# Patient Record
Sex: Female | Born: 1970 | Race: White | Hispanic: Yes | Marital: Married | Smoking: Never smoker
Health system: Southern US, Community
[De-identification: ages and names within clinical notes are randomized; demographics above are authoritative.]

## PROBLEM LIST (undated history)

## (undated) ENCOUNTER — Inpatient Hospital Stay (HOSPITAL_COMMUNITY): Payer: Self-pay

## (undated) DIAGNOSIS — D649 Anemia, unspecified: Secondary | ICD-10-CM

## (undated) DIAGNOSIS — I10 Essential (primary) hypertension: Secondary | ICD-10-CM

## (undated) DIAGNOSIS — G510 Bell's palsy: Secondary | ICD-10-CM

## (undated) DIAGNOSIS — O09529 Supervision of elderly multigravida, unspecified trimester: Secondary | ICD-10-CM

## (undated) HISTORY — PX: NO PAST SURGERIES: SHX2092

## (undated) HISTORY — DX: Bell's palsy: G51.0

## (undated) HISTORY — DX: Anemia, unspecified: D64.9

## (undated) HISTORY — DX: Essential (primary) hypertension: I10

## (undated) HISTORY — DX: Supervision of elderly multigravida, unspecified trimester: O09.529

---

## 2013-06-25 ENCOUNTER — Other Ambulatory Visit (HOSPITAL_COMMUNITY): Payer: Self-pay | Admitting: Nurse Practitioner

## 2013-06-25 DIAGNOSIS — Z3689 Encounter for other specified antenatal screening: Secondary | ICD-10-CM

## 2013-06-25 LAB — OB RESULTS CONSOLE RUBELLA ANTIBODY, IGM: RUBELLA: IMMUNE

## 2013-06-25 LAB — OB RESULTS CONSOLE ANTIBODY SCREEN: Antibody Screen: NEGATIVE

## 2013-06-25 LAB — OB RESULTS CONSOLE HIV ANTIBODY (ROUTINE TESTING): HIV: NONREACTIVE

## 2013-06-25 LAB — OB RESULTS CONSOLE GC/CHLAMYDIA
Chlamydia: NEGATIVE
GC PROBE AMP, GENITAL: NEGATIVE

## 2013-06-25 LAB — OB RESULTS CONSOLE HEPATITIS B SURFACE ANTIGEN: Hepatitis B Surface Ag: NEGATIVE

## 2013-06-25 LAB — OB RESULTS CONSOLE ABO/RH: RH Type: POSITIVE

## 2013-06-25 LAB — OB RESULTS CONSOLE RPR: RPR: NONREACTIVE

## 2013-08-10 ENCOUNTER — Other Ambulatory Visit (HOSPITAL_COMMUNITY): Payer: Self-pay | Admitting: Nurse Practitioner

## 2013-08-10 ENCOUNTER — Ambulatory Visit (HOSPITAL_COMMUNITY)
Admission: RE | Admit: 2013-08-10 | Discharge: 2013-08-10 | Disposition: A | Discharge status: Home / Self Care | Payer: Self-pay | Source: Ambulatory Visit | Attending: Nurse Practitioner | Admitting: Nurse Practitioner

## 2013-08-10 DIAGNOSIS — Z363 Encounter for antenatal screening for malformations: Secondary | ICD-10-CM | POA: Insufficient documentation

## 2013-08-10 DIAGNOSIS — O09519 Supervision of elderly primigravida, unspecified trimester: Secondary | ICD-10-CM | POA: Insufficient documentation

## 2013-08-10 DIAGNOSIS — Z3689 Encounter for other specified antenatal screening: Secondary | ICD-10-CM

## 2013-08-10 DIAGNOSIS — O358XX0 Maternal care for other (suspected) fetal abnormality and damage, not applicable or unspecified: Secondary | ICD-10-CM | POA: Insufficient documentation

## 2013-08-10 DIAGNOSIS — Z1389 Encounter for screening for other disorder: Secondary | ICD-10-CM | POA: Insufficient documentation

## 2013-10-26 ENCOUNTER — Other Ambulatory Visit (HOSPITAL_COMMUNITY): Payer: Self-pay | Admitting: Nurse Practitioner

## 2013-10-26 DIAGNOSIS — O09529 Supervision of elderly multigravida, unspecified trimester: Secondary | ICD-10-CM

## 2013-10-30 ENCOUNTER — Ambulatory Visit (HOSPITAL_COMMUNITY)
Admission: RE | Admit: 2013-10-30 | Discharge: 2013-10-30 | Disposition: A | Discharge status: Home / Self Care | Payer: Self-pay | Source: Ambulatory Visit | Attending: Nurse Practitioner | Admitting: Nurse Practitioner

## 2013-10-30 DIAGNOSIS — Z3689 Encounter for other specified antenatal screening: Secondary | ICD-10-CM | POA: Insufficient documentation

## 2013-10-30 DIAGNOSIS — O09529 Supervision of elderly multigravida, unspecified trimester: Secondary | ICD-10-CM | POA: Insufficient documentation

## 2013-11-23 ENCOUNTER — Other Ambulatory Visit (HOSPITAL_COMMUNITY): Payer: Self-pay | Admitting: Nurse Practitioner

## 2013-11-23 DIAGNOSIS — Z0374 Encounter for suspected problem with fetal growth ruled out: Secondary | ICD-10-CM

## 2013-12-03 ENCOUNTER — Ambulatory Visit (HOSPITAL_COMMUNITY)
Admission: RE | Admit: 2013-12-03 | Discharge: 2013-12-03 | Disposition: A | Discharge status: Home / Self Care | Payer: Self-pay | Source: Ambulatory Visit | Attending: Nurse Practitioner | Admitting: Nurse Practitioner

## 2013-12-03 DIAGNOSIS — Z0374 Encounter for suspected problem with fetal growth ruled out: Secondary | ICD-10-CM

## 2013-12-03 DIAGNOSIS — O26849 Uterine size-date discrepancy, unspecified trimester: Secondary | ICD-10-CM | POA: Insufficient documentation

## 2013-12-03 DIAGNOSIS — Z3689 Encounter for other specified antenatal screening: Secondary | ICD-10-CM | POA: Insufficient documentation

## 2013-12-07 LAB — OB RESULTS CONSOLE GBS: STREP GROUP B AG: NEGATIVE

## 2013-12-11 ENCOUNTER — Ambulatory Visit (INDEPENDENT_AMBULATORY_CARE_PROVIDER_SITE_OTHER): Payer: Self-pay | Admitting: *Deleted

## 2013-12-11 VITALS — BP 128/85 | HR 85

## 2013-12-11 DIAGNOSIS — O09529 Supervision of elderly multigravida, unspecified trimester: Secondary | ICD-10-CM

## 2013-12-11 DIAGNOSIS — O09519 Supervision of elderly primigravida, unspecified trimester: Secondary | ICD-10-CM

## 2013-12-11 LAB — US OB FOLLOW UP

## 2013-12-11 NOTE — Addendum Note (Signed)
Addended by: Jill SideAY, Keian Odriscoll L on: 12/11/2013 12:38 PM   Modules accepted: Orders

## 2013-12-12 DIAGNOSIS — O09529 Supervision of elderly multigravida, unspecified trimester: Secondary | ICD-10-CM | POA: Insufficient documentation

## 2013-12-12 NOTE — Progress Notes (Signed)
NST reviewed and reactive.  

## 2013-12-14 ENCOUNTER — Other Ambulatory Visit (HOSPITAL_COMMUNITY): Payer: Self-pay | Admitting: Nurse Practitioner

## 2013-12-14 DIAGNOSIS — O09529 Supervision of elderly multigravida, unspecified trimester: Secondary | ICD-10-CM

## 2013-12-18 ENCOUNTER — Ambulatory Visit (INDEPENDENT_AMBULATORY_CARE_PROVIDER_SITE_OTHER): Payer: Self-pay | Admitting: *Deleted

## 2013-12-18 VITALS — BP 119/69 | HR 70

## 2013-12-18 DIAGNOSIS — O09529 Supervision of elderly multigravida, unspecified trimester: Secondary | ICD-10-CM

## 2013-12-18 NOTE — Progress Notes (Signed)
Pt has next appt for prenatal visit and NST @ [facility] on 5/18.  Pt encouraged to increase po fluid intake due to low normal AFI.  IOL scheduled on 5/28 @ 0630 per protocol.  Delorise RoyalsJulie Sowell - (interpreter) present for visit.

## 2013-12-21 ENCOUNTER — Other Ambulatory Visit (HOSPITAL_COMMUNITY): Payer: Self-pay | Admitting: Physician Assistant

## 2013-12-21 DIAGNOSIS — O09529 Supervision of elderly multigravida, unspecified trimester: Secondary | ICD-10-CM

## 2013-12-24 ENCOUNTER — Telehealth (HOSPITAL_COMMUNITY): Payer: Self-pay | Admitting: *Deleted

## 2013-12-24 ENCOUNTER — Encounter (HOSPITAL_COMMUNITY): Payer: Self-pay | Admitting: *Deleted

## 2013-12-24 NOTE — Telephone Encounter (Signed)
Preadmission screen  

## 2013-12-25 ENCOUNTER — Ambulatory Visit (HOSPITAL_COMMUNITY)
Admission: RE | Admit: 2013-12-25 | Discharge: 2013-12-25 | Disposition: A | Discharge status: Home / Self Care | Payer: Medicaid Other | Source: Ambulatory Visit | Attending: Physician Assistant | Admitting: Physician Assistant

## 2013-12-25 DIAGNOSIS — O09529 Supervision of elderly multigravida, unspecified trimester: Secondary | ICD-10-CM | POA: Insufficient documentation

## 2013-12-25 DIAGNOSIS — Z3689 Encounter for other specified antenatal screening: Secondary | ICD-10-CM | POA: Insufficient documentation

## 2013-12-29 ENCOUNTER — Inpatient Hospital Stay (HOSPITAL_COMMUNITY): Payer: Medicaid Other | Admitting: Anesthesiology

## 2013-12-29 ENCOUNTER — Inpatient Hospital Stay (HOSPITAL_COMMUNITY)
Admission: AD | Admit: 2013-12-29 | Discharge: 2014-01-01 | DRG: 765 | Disposition: A | Discharge status: Home / Self Care | Payer: Medicaid Other | Source: Ambulatory Visit | Attending: Obstetrics & Gynecology | Admitting: Obstetrics & Gynecology

## 2013-12-29 ENCOUNTER — Encounter (HOSPITAL_COMMUNITY): Payer: Self-pay | Admitting: *Deleted

## 2013-12-29 ENCOUNTER — Other Ambulatory Visit: Payer: Self-pay

## 2013-12-29 ENCOUNTER — Encounter (HOSPITAL_COMMUNITY): Payer: Medicaid Other | Admitting: Anesthesiology

## 2013-12-29 DIAGNOSIS — O09529 Supervision of elderly multigravida, unspecified trimester: Secondary | ICD-10-CM

## 2013-12-29 DIAGNOSIS — IMO0002 Reserved for concepts with insufficient information to code with codable children: Secondary | ICD-10-CM | POA: Diagnosis present

## 2013-12-29 DIAGNOSIS — O4690 Antepartum hemorrhage, unspecified, unspecified trimester: Secondary | ICD-10-CM | POA: Diagnosis present

## 2013-12-29 DIAGNOSIS — O411090 Infection of amniotic sac and membranes, unspecified, unspecified trimester, not applicable or unspecified: Secondary | ICD-10-CM | POA: Diagnosis present

## 2013-12-29 DIAGNOSIS — O09519 Supervision of elderly primigravida, unspecified trimester: Secondary | ICD-10-CM | POA: Diagnosis present

## 2013-12-29 LAB — ABO/RH: ABO/RH(D): O POS

## 2013-12-29 LAB — CBC
HEMATOCRIT: 38 % (ref 36.0–46.0)
Hemoglobin: 13.3 g/dL (ref 12.0–15.0)
MCH: 31.7 pg (ref 26.0–34.0)
MCHC: 35 g/dL (ref 30.0–36.0)
MCV: 90.7 fL (ref 78.0–100.0)
Platelets: 194 10*3/uL (ref 150–400)
RBC: 4.19 MIL/uL (ref 3.87–5.11)
RDW: 13.3 % (ref 11.5–15.5)
WBC: 10.2 10*3/uL (ref 4.0–10.5)

## 2013-12-29 LAB — COMPREHENSIVE METABOLIC PANEL
ALK PHOS: 174 U/L — AB (ref 39–117)
ALT: 11 U/L (ref 0–35)
AST: 18 U/L (ref 0–37)
Albumin: 2.6 g/dL — ABNORMAL LOW (ref 3.5–5.2)
BILIRUBIN TOTAL: 0.2 mg/dL — AB (ref 0.3–1.2)
BUN: 14 mg/dL (ref 6–23)
CHLORIDE: 98 meq/L (ref 96–112)
CO2: 21 mEq/L (ref 19–32)
CREATININE: 0.67 mg/dL (ref 0.50–1.10)
Calcium: 9.1 mg/dL (ref 8.4–10.5)
GFR calc non Af Amer: >90 mL/min (ref >90)
Glucose, Bld: 80 mg/dL (ref 70–99)
Potassium: 4.3 mEq/L (ref 3.7–5.3)
Sodium: 135 mEq/L — ABNORMAL LOW (ref 137–147)
Total Protein: 6.7 g/dL (ref 6.0–8.3)

## 2013-12-29 LAB — PROTEIN / CREATININE RATIO, URINE
CREATININE, URINE: 41.89 mg/dL
Protein Creatinine Ratio: 0.74 — ABNORMAL HIGH (ref 0.00–0.15)
Total Protein, Urine: 31.1 mg/dL

## 2013-12-29 LAB — TYPE AND SCREEN
ABO/RH(D): O POS
Antibody Screen: NEGATIVE

## 2013-12-29 LAB — RPR

## 2013-12-29 MED ORDER — PHENYLEPHRINE 40 MCG/ML (10ML) SYRINGE FOR IV PUSH (FOR BLOOD PRESSURE SUPPORT)
80.0000 ug | PREFILLED_SYRINGE | INTRAVENOUS | Status: DC | PRN
  Filled 2013-12-29: qty 10

## 2013-12-29 MED ORDER — LACTATED RINGERS IV SOLN
INTRAVENOUS | Status: DC
  Administered 2013-12-29 (×2): via INTRAVENOUS

## 2013-12-29 MED ORDER — OXYTOCIN 40 UNITS IN LACTATED RINGERS INFUSION - SIMPLE MED: 62.5000 mL/h | INTRAVENOUS | Status: DC

## 2013-12-29 MED ORDER — CITRIC ACID-SODIUM CITRATE 334-500 MG/5ML PO SOLN
30.0000 mL | ORAL | Status: DC | PRN
  Administered 2013-12-30: 30 mL via ORAL
  Filled 2013-12-29: qty 15

## 2013-12-29 MED ORDER — EPHEDRINE 5 MG/ML INJ
10.0000 mg | INTRAVENOUS | Status: DC | PRN
  Filled 2013-12-29: qty 4

## 2013-12-29 MED ORDER — FENTANYL CITRATE 0.05 MG/ML IJ SOLN: 100.0000 ug | INTRAMUSCULAR | Status: DC | PRN

## 2013-12-29 MED ORDER — LACTATED RINGERS IV SOLN
500.0000 mL | INTRAVENOUS | Status: DC | PRN
  Administered 2013-12-30: 200 mL via INTRAVENOUS

## 2013-12-29 MED ORDER — FENTANYL 2.5 MCG/ML BUPIVACAINE 1/10 % EPIDURAL INFUSION (WH - ANES): 14.0000 mL/h | INTRAMUSCULAR | Status: DC | PRN

## 2013-12-29 MED ORDER — DIPHENHYDRAMINE HCL 50 MG/ML IJ SOLN: 12.5000 mg | INTRAMUSCULAR | Status: DC | PRN

## 2013-12-29 MED ORDER — PHENYLEPHRINE 40 MCG/ML (10ML) SYRINGE FOR IV PUSH (FOR BLOOD PRESSURE SUPPORT): 80.0000 ug | PREFILLED_SYRINGE | INTRAVENOUS | Status: DC | PRN

## 2013-12-29 MED ORDER — SODIUM BICARBONATE 8.4 % IV SOLN
INTRAVENOUS | Status: DC | PRN
  Administered 2013-12-29 – 2013-12-30 (×3): 5 mL via EPIDURAL

## 2013-12-29 MED ORDER — LIDOCAINE HCL (PF) 1 % IJ SOLN: 30.0000 mL | INTRAMUSCULAR | Status: DC | PRN

## 2013-12-29 MED ORDER — ACETAMINOPHEN 325 MG PO TABS
650.0000 mg | ORAL_TABLET | ORAL | Status: DC | PRN
  Administered 2013-12-30: 650 mg via ORAL
  Filled 2013-12-29: qty 2

## 2013-12-29 MED ORDER — OXYTOCIN BOLUS FROM INFUSION: 500.0000 mL | INTRAVENOUS | Status: DC

## 2013-12-29 MED ORDER — EPHEDRINE 5 MG/ML INJ: 10.0000 mg | INTRAVENOUS | Status: DC | PRN

## 2013-12-29 MED ORDER — ONDANSETRON HCL 4 MG/2ML IJ SOLN: 4.0000 mg | Freq: Four times a day (QID) | INTRAMUSCULAR | Status: DC | PRN

## 2013-12-29 MED ORDER — IBUPROFEN 600 MG PO TABS: 600.0000 mg | ORAL_TABLET | Freq: Four times a day (QID) | ORAL | Status: DC | PRN

## 2013-12-29 MED ORDER — OXYCODONE-ACETAMINOPHEN 5-325 MG PO TABS: 1.0000 | ORAL_TABLET | ORAL | Status: DC | PRN

## 2013-12-29 MED ORDER — LACTATED RINGERS IV SOLN
INTRAVENOUS | Status: DC
  Administered 2013-12-29 – 2013-12-31 (×5): via INTRAVENOUS

## 2013-12-29 MED ORDER — TERBUTALINE SULFATE 1 MG/ML IJ SOLN
0.2500 mg | Freq: Once | INTRAMUSCULAR | Status: AC | PRN
Start: 2013-12-29 — End: 2013-12-29

## 2013-12-29 MED ORDER — FENTANYL 2.5 MCG/ML BUPIVACAINE 1/10 % EPIDURAL INFUSION (WH - ANES)
14.0000 mL/h | INTRAMUSCULAR | Status: DC | PRN
  Administered 2013-12-29: 14 mL/h via EPIDURAL
  Filled 2013-12-29: qty 125

## 2013-12-29 MED ORDER — LACTATED RINGERS IV SOLN
500.0000 mL | Freq: Once | INTRAVENOUS | Status: AC
  Administered 2013-12-29: 500 mL via INTRAVENOUS

## 2013-12-29 NOTE — Progress Notes (Signed)
   Connie Newman is a 43 y.o. G1P0 at [redacted]w[redacted]d  admitted for induction of labor due to AMA/prolonged variable decel. Dx of  Preeclampsia with mild features  Subjective: Comfortable with epidural  Objective: Filed Vitals:   12/29/13 2335 12/29/13 2337 12/29/13 2340 12/29/13 2342  BP: 128/70  129/68   Pulse: 88 92 78 93  Temp:      TempSrc:      Resp: 18  18   Height:      Weight:      SpO2:  97%  97%      FHT:  FHR: 140 bpm, variability: moderate,  accelerations:  Present,  decelerations:  Absent UC:   irregular, every  minutes SVE:   Dilation: 5.5 Effacement (%): 80 Station: -2 Exam by:: C.Byers, RN  Labs: Lab Results  Component Value Date   WBC 10.2 12/29/2013   HGB 13.3 12/29/2013   HCT 38.0 12/29/2013   MCV 90.7 12/29/2013   PLT 194 12/29/2013    Assessment / Plan: IOL for AMA; mild preeclampsia  Labor: Progressing normally Fetal Wellbeing:  Category II Pain Control:  Epidural Anticipated MOD:  NSVD  Jacklyn Shell 12/29/2013, 11:43 PM

## 2013-12-29 NOTE — H&P (Signed)
Connie Newman is a 43 y.o. female G1P0 with IUP at [redacted]w[redacted]d presenting for vaginal bleeding. Pt states that she started having vaginal bleeding this morning and that she has been having minor abdominal contractions that started last night 2/10. Pt states normal fetal movement and no LOF.  [facility]are at [facility] since first trimester Prenatal History/Complications: AMA - 42 abnml 1 hr, normal 3hr  Past Medical History: Past Medical History  Diagnosis Date  . 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  Obstetrical History: OB History   Grav Para Term Preterm Abortions TAB SAB Ect Mult Living   1               Gynecological History: OB History   Grav Para Term Preterm Abortions TAB SAB Ect Mult Living   1               Social History: History   Social History  . Marital Status: Single    Spouse Name: N/A    Number of Children: N/A  . Years of Education: N/A   Social History Main Topics  . Smoking status: Not on file  . Smokeless tobacco: Not on file  . Alcohol Use: Not on file  . Drug Use: Not on file  . Sexual Activity: Not on file   Other Topics Concern  . Not on file   Social History Narrative  . No narrative on file    Family History: No family history on file.  Allergies: No Known Allergies  Prescriptions prior to admission  Medication Sig Dispense Refill  . Prenatal Vit-Fe Fumarate-FA (PRENATAL MULTIVITAMIN) TABS tablet Take 1 tablet by mouth daily at 12 noon.         Review of Systems   Constitutional: no headache, vision changes, or abdominal pain, no other complaints  Blood pressure 148/87, pulse 77, temperature 97.7 F (36.5 C), temperature source Oral, resp. rate 18, last menstrual period 03/26/2013, SpO2 99.00%. General appearance: alert, cooperative, appears stated age and no distress Lungs: clear to auscultation bilaterally Heart: regular  rate and rhythm Abdomen: soft, non-tender; bowel sounds normal Pelvic: adequate, old blood on exam Extremities: Homans sign is negative, no sign of DVT DTR's 1+ Presentation: cephalic Fetal monitoringBaseline: 130s bpm, Variability: Good {> 6 bpm), Accelerations: Reactive and Decelerations: proloned decel to 80s for 3 mins with slow recover with O2, fluid and reposition Uterine activity irreg ctx q4-18min  Dilation: 1 Effacement (%): Thick Station: -3 Exam by:: Dr. Ike Bene   Prenatal labs: ABO, Rh: O/Positive/-- (11/20 0000) Antibody: Negative (11/20 0000) Rubella:   RPR: Nonreactive (11/20 0000)  HBsAg: Negative (11/20 0000)  HIV: Non-reactive (11/20 0000)  GBS: Negative (05/04 0000)    Prenatal Transfer Tool  Maternal Diabetes: Abnl 1 hr, normal 3 hr Genetic Screening: Declined Maternal Ultrasounds/Referrals: Normal Fetal Ultrasounds or other Referrals:  None Maternal Substance Abuse:  No Significant Maternal Medications:  None Significant Maternal Lab Results: Lab values include: Group B Strep negative     No results found for this or any previous visit (from the past 24 hour(s)).  Assessment: Connie Newman is a 43 y.o. G1P0 at [redacted]w[redacted]d by L=20 here for vaginal bleeding having non reassuring fetal status. Will admit and start induction at this tim #Labor: IOL with FB - start now, minimal benefit for 2 additional days with evidence of fetal distress on  monitor #Pain: Desires IV pain meds and Epidural #FWB: Cat II #ID:  GBS Neg no ABx at this time #MOF: Breast #HTN: elevated BP - collect CMP and Pro:Cr  Minta BalsamMichael R Taeja Debellis 12/29/2013, 12:53 PM

## 2013-12-29 NOTE — MAU Note (Signed)
Pt reports some bleeding yesterday and contractions today. +FM.

## 2013-12-29 NOTE — Progress Notes (Addendum)
Connie Newman is a 43 y.o. G1P0 at [redacted]w[redacted]d admitted for induction of labor due to [facility] and NRFHT.  Subjective: Reports more discomfort, though notes she does not want anything for this at this time.  Objective: BP 149/88  Pulse 80  Temp(Src) 98.6 F (37 C) (Oral)  Resp 18  Ht 5' 1.42" (1.56 m)  Wt 78.472 kg (173 lb)  BMI 32.25 kg/m2  SpO2 99%  LMP 03/26/2013    SROM a few minutes ago with clear fluid.  Foley still in  FHT:  FHR: 145 bpm, variability: moderate,  accelerations:  Present,  decelerations:  Absent UC:   irregular, every 3-10 minutes SVE:   Dilation: 3 Effacement (%): Thick Station: -3 Exam by:: F. Crezenso-Dishmon, CNM  Labs: Lab Results  Component Value Date   WBC 10.2 12/29/2013   HGB 13.3 12/29/2013   HCT 38.0 12/29/2013   MCV 90.7 12/29/2013   PLT 194 12/29/2013    Assessment / Plan: Induction of labor due to AMA and NRFHT,  progressing well on pitocin  Labor: progressing with foley bulb Preeclampsia:  patient with mild pre-eclampsia, P/C 0.74. Will plan for magnesium if reaches severe range pre-eclampsia Fetal Wellbeing:  Category I Pain Control:  Labor support without medications I/D:  n/a Anticipated MOD:  NSVD  Glori Luis 12/29/2013, 10:01 PM

## 2013-12-29 NOTE — Progress Notes (Signed)
Connie Newman is a 43 y.o. G1P0 at [redacted]w[redacted]d admitted for induction of labor due to [facility] and NRFHT.  Subjective: Comfortable no complaints  Objective: BP 129/70  Pulse 66  Temp(Src) 98.1 F (36.7 C) (Oral)  Resp 20  Ht 5' 1.42" (1.56 m)  Wt 78.472 kg (173 lb)  BMI 32.25 kg/m2  SpO2 99%  LMP 03/26/2013      FHT:  FHR: 140s bpm, variability: moderate,  accelerations:   ,  decelerations:  Absent UC:   irregular, every 6-10 minutes SVE:   Dilation: 1 Effacement (%): Thick Station: -3 Exam by:: Dr. Ike Bene  Labs: Lab Results  Component Value Date   WBC 10.2 12/29/2013   HGB 13.3 12/29/2013   HCT 38.0 12/29/2013   MCV 90.7 12/29/2013   PLT 194 12/29/2013   Results for orders placed during the hospital encounter of 12/29/13 (from the past 24 hour(s))  ABO/RH     Status: None   Collection Time    12/29/13 12:25 PM      Result Value Ref Range   ABO/RH(D) O POS    CBC     Status: None   Collection Time    12/29/13 12:35 PM      Result Value Ref Range   WBC 10.2  4.0 - 10.5 K/uL   RBC 4.19  3.87 - 5.11 MIL/uL   Hemoglobin 13.3  12.0 - 15.0 g/dL   HCT 37.1  06.2 - 69.4 %   MCV 90.7  78.0 - 100.0 fL   MCH 31.7  26.0 - 34.0 pg   MCHC 35.0  30.0 - 36.0 g/dL   RDW 85.4  62.7 - 03.5 %   Platelets 194  150 - 400 K/uL  COMPREHENSIVE METABOLIC PANEL     Status: Abnormal   Collection Time    12/29/13 12:35 PM      Result Value Ref Range   Sodium 135 (*) 137 - 147 mEq/L   Potassium 4.3  3.7 - 5.3 mEq/L   Chloride 98  96 - 112 mEq/L   CO2 21  19 - 32 mEq/L   Glucose, Bld 80  70 - 99 mg/dL   BUN 14  6 - 23 mg/dL   Creatinine, Ser 0.09  0.50 - 1.10 mg/dL   Calcium 9.1  8.4 - 38.1 mg/dL   Total Protein 6.7  6.0 - 8.3 g/dL   Albumin 2.6 (*) 3.5 - 5.2 g/dL   AST 18  0 - 37 U/L   ALT 11  0 - 35 U/L   Alkaline Phosphatase 174 (*) 39 - 117 U/L   Total Bilirubin 0.2 (*) 0.3 - 1.2 mg/dL   GFR calc non Af Amer >90  >90 mL/min   GFR calc Af Amer >90  >90 mL/min  TYPE AND SCREEN      Status: None   Collection Time    12/29/13 12:35 PM      Result Value Ref Range   ABO/RH(D) O POS     Antibody Screen NEG     Sample Expiration 01/01/2014    PROTEIN / CREATININE RATIO, URINE     Status: Abnormal   Collection Time    12/29/13  3:59 PM      Result Value Ref Range   Creatinine, Urine 41.89     Total Protein, Urine 31.1     PROTEIN CREATININE RATIO 0.74 (*) 0.00 - 0.15     Assessment / Plan: IOL for NRFHT, FB in place  Labor: FB in place, augment as needed Preeclampsia:  no signs or symptoms of toxicity and labs stable Fetal Wellbeing:  Category I Pain Control:  Epidural and Fentanyl PRN I/D:  n/a Anticipated MOD:  NSVD  Minta BalsamMichael R Teigan Sahli 12/29/2013, 5:46 PM

## 2013-12-29 NOTE — Anesthesia Preprocedure Evaluation (Signed)

## 2013-12-29 NOTE — Anesthesia Procedure Notes (Signed)

## 2013-12-29 NOTE — MAU Note (Signed)
Patient presents to MAU with c/o irregular contractions every 10 minutes. Reports bloody show since yesterday. Reports good fetal movement. Denies LOF. States was closed at last SVE last week. Rates pain 2/10. (Interpretor at bedside)

## 2013-12-29 NOTE — Telephone Encounter (Signed)
[id number] interpreter number

## 2013-12-30 ENCOUNTER — Encounter (HOSPITAL_COMMUNITY)
Admission: AD | Disposition: A | Discharge status: Home / Self Care | Payer: Self-pay | Source: Ambulatory Visit | Attending: Obstetrics & Gynecology

## 2013-12-30 ENCOUNTER — Encounter (HOSPITAL_COMMUNITY): Payer: Self-pay

## 2013-12-30 DIAGNOSIS — IMO0002 Reserved for concepts with insufficient information to code with codable children: Secondary | ICD-10-CM

## 2013-12-30 DIAGNOSIS — O411090 Infection of amniotic sac and membranes, unspecified, unspecified trimester, not applicable or unspecified: Secondary | ICD-10-CM

## 2013-12-30 SURGERY — Surgical Case
Anesthesia: Epidural | Site: Abdomen

## 2013-12-30 MED ORDER — BUPIVACAINE HCL (PF) 0.5 % IJ SOLN
INTRAMUSCULAR | Status: AC
  Filled 2013-12-30: qty 30

## 2013-12-30 MED ORDER — DIPHENHYDRAMINE HCL 25 MG PO CAPS: 25.0000 mg | ORAL_CAPSULE | Freq: Four times a day (QID) | ORAL | Status: DC | PRN

## 2013-12-30 MED ORDER — LACTATED RINGERS IV SOLN
INTRAVENOUS | Status: DC
  Administered 2013-12-30: 18:00:00 via INTRAVENOUS

## 2013-12-30 MED ORDER — MEPERIDINE HCL 25 MG/ML IJ SOLN: 6.2500 mg | INTRAMUSCULAR | Status: DC | PRN

## 2013-12-30 MED ORDER — KETOROLAC TROMETHAMINE 30 MG/ML IJ SOLN
30.0000 mg | Freq: Four times a day (QID) | INTRAMUSCULAR | Status: AC | PRN
Start: 2013-12-30 — End: 2013-12-31
  Administered 2013-12-30: 30 mg via INTRAVENOUS

## 2013-12-30 MED ORDER — SIMETHICONE 80 MG PO CHEW: 80.0000 mg | CHEWABLE_TABLET | ORAL | Status: DC | PRN

## 2013-12-30 MED ORDER — KETOROLAC TROMETHAMINE 30 MG/ML IJ SOLN: 30.0000 mg | Freq: Four times a day (QID) | INTRAMUSCULAR | Status: AC | PRN

## 2013-12-30 MED ORDER — DEXTROSE 5 % IV SOLN
1.0000 ug/kg/h | INTRAVENOUS | Status: DC | PRN
  Filled 2013-12-30: qty 2

## 2013-12-30 MED ORDER — LIDOCAINE-EPINEPHRINE (PF) 2 %-1:200000 IJ SOLN
INTRAMUSCULAR | Status: AC
  Filled 2013-12-30: qty 20

## 2013-12-30 MED ORDER — LANOLIN HYDROUS EX OINT: 1.0000 "application " | TOPICAL_OINTMENT | CUTANEOUS | Status: DC | PRN

## 2013-12-30 MED ORDER — MORPHINE SULFATE 0.5 MG/ML IJ SOLN
INTRAMUSCULAR | Status: AC
  Filled 2013-12-30: qty 10

## 2013-12-30 MED ORDER — SENNOSIDES-DOCUSATE SODIUM 8.6-50 MG PO TABS
2.0000 | ORAL_TABLET | ORAL | Status: DC
  Administered 2013-12-31 (×2): 2 via ORAL
  Filled 2013-12-30 (×2): qty 2

## 2013-12-30 MED ORDER — SCOPOLAMINE 1 MG/3DAYS TD PT72
MEDICATED_PATCH | TRANSDERMAL | Status: AC
  Filled 2013-12-30: qty 1

## 2013-12-30 MED ORDER — SIMETHICONE 80 MG PO CHEW
80.0000 mg | CHEWABLE_TABLET | ORAL | Status: DC
  Administered 2013-12-31 (×2): 80 mg via ORAL
  Filled 2013-12-30 (×2): qty 1

## 2013-12-30 MED ORDER — TERBUTALINE SULFATE 1 MG/ML IJ SOLN
INTRAMUSCULAR | Status: AC
Start: 2013-12-30 — End: 2013-12-30
  Filled 2013-12-30: qty 1

## 2013-12-30 MED ORDER — DIPHENHYDRAMINE HCL 50 MG/ML IJ SOLN: 25.0000 mg | INTRAMUSCULAR | Status: DC | PRN

## 2013-12-30 MED ORDER — ZOLPIDEM TARTRATE 5 MG PO TABS: 5.0000 mg | ORAL_TABLET | Freq: Every evening | ORAL | Status: DC | PRN

## 2013-12-30 MED ORDER — MORPHINE SULFATE (PF) 0.5 MG/ML IJ SOLN
INTRAMUSCULAR | Status: DC | PRN
  Administered 2013-12-30: 4 mg via EPIDURAL

## 2013-12-30 MED ORDER — OXYTOCIN 40 UNITS IN LACTATED RINGERS INFUSION - SIMPLE MED: 62.5000 mL/h | INTRAVENOUS | Status: AC

## 2013-12-30 MED ORDER — LACTATED RINGERS IV SOLN
INTRAVENOUS | Status: DC | PRN
  Administered 2013-12-30: 04:00:00 via INTRAVENOUS

## 2013-12-30 MED ORDER — OXYCODONE-ACETAMINOPHEN 5-325 MG PO TABS: 1.0000 | ORAL_TABLET | ORAL | Status: DC | PRN

## 2013-12-30 MED ORDER — KETOROLAC TROMETHAMINE 30 MG/ML IJ SOLN
INTRAMUSCULAR | Status: AC
  Filled 2013-12-30: qty 1

## 2013-12-30 MED ORDER — TERBUTALINE SULFATE 1 MG/ML IJ SOLN
0.2500 mg | Freq: Once | INTRAMUSCULAR | Status: AC
  Administered 2013-12-30: 0.25 mg via SUBCUTANEOUS

## 2013-12-30 MED ORDER — SODIUM CHLORIDE 0.9 % IV SOLN
2.0000 g | Freq: Four times a day (QID) | INTRAVENOUS | Status: DC
  Administered 2013-12-30: 2 g via INTRAVENOUS
  Filled 2013-12-30 (×5): qty 2000

## 2013-12-30 MED ORDER — ONDANSETRON HCL 4 MG/2ML IJ SOLN
INTRAMUSCULAR | Status: AC
  Filled 2013-12-30: qty 2

## 2013-12-30 MED ORDER — ONDANSETRON HCL 4 MG/2ML IJ SOLN: 4.0000 mg | Freq: Three times a day (TID) | INTRAMUSCULAR | Status: DC | PRN

## 2013-12-30 MED ORDER — PROMETHAZINE HCL 25 MG/ML IJ SOLN
6.2500 mg | Freq: Once | INTRAMUSCULAR | Status: AC
  Administered 2013-12-30: 6.25 mg via INTRAVENOUS

## 2013-12-30 MED ORDER — METOCLOPRAMIDE HCL 5 MG/ML IJ SOLN
INTRAMUSCULAR | Status: AC
  Filled 2013-12-30: qty 2

## 2013-12-30 MED ORDER — BUPIVACAINE HCL (PF) 0.5 % IJ SOLN
INTRAMUSCULAR | Status: DC | PRN
  Administered 2013-12-30: 30 mL

## 2013-12-30 MED ORDER — HYDROMORPHONE HCL PF 1 MG/ML IJ SOLN: 0.2500 mg | INTRAMUSCULAR | Status: DC | PRN

## 2013-12-30 MED ORDER — ONDANSETRON 8 MG/NS 50 ML IVPB
8.0000 mg | Freq: Once | INTRAVENOUS | Status: AC
Start: 2013-12-30 — End: 2013-12-30
  Administered 2013-12-30: 8 mg via INTRAVENOUS
  Filled 2013-12-30: qty 8

## 2013-12-30 MED ORDER — DIBUCAINE 1 % RE OINT: 1.0000 "application " | TOPICAL_OINTMENT | RECTAL | Status: DC | PRN

## 2013-12-30 MED ORDER — SIMETHICONE 80 MG PO CHEW
80.0000 mg | CHEWABLE_TABLET | Freq: Three times a day (TID) | ORAL | Status: DC
  Administered 2013-12-30 – 2014-01-01 (×5): 80 mg via ORAL
  Filled 2013-12-30 (×4): qty 1

## 2013-12-30 MED ORDER — ONDANSETRON HCL 4 MG/2ML IJ SOLN
INTRAMUSCULAR | Status: DC | PRN
  Administered 2013-12-30: 4 mg via INTRAVENOUS

## 2013-12-30 MED ORDER — PIPERACILLIN-TAZOBACTAM 3.375 G IVPB
3.3750 g | Freq: Three times a day (TID) | INTRAVENOUS | Status: DC
  Administered 2013-12-30 – 2013-12-31 (×3): 3.375 g via INTRAVENOUS
  Filled 2013-12-30 (×5): qty 50

## 2013-12-30 MED ORDER — DIPHENHYDRAMINE HCL 50 MG/ML IJ SOLN: 12.5000 mg | INTRAMUSCULAR | Status: DC | PRN

## 2013-12-30 MED ORDER — DIPHENHYDRAMINE HCL 25 MG PO CAPS: 25.0000 mg | ORAL_CAPSULE | ORAL | Status: DC | PRN

## 2013-12-30 MED ORDER — SODIUM CHLORIDE 0.9 % IJ SOLN: 3.0000 mL | INTRAMUSCULAR | Status: DC | PRN

## 2013-12-30 MED ORDER — ONDANSETRON HCL 4 MG/2ML IJ SOLN
4.0000 mg | INTRAMUSCULAR | Status: DC | PRN
  Administered 2013-12-30: 4 mg via INTRAVENOUS

## 2013-12-30 MED ORDER — SCOPOLAMINE 1 MG/3DAYS TD PT72
1.0000 | MEDICATED_PATCH | Freq: Once | TRANSDERMAL | Status: DC
  Administered 2013-12-30: 1.5 mg via TRANSDERMAL

## 2013-12-30 MED ORDER — NALBUPHINE HCL 10 MG/ML IJ SOLN: 5.0000 mg | INTRAMUSCULAR | Status: DC | PRN

## 2013-12-30 MED ORDER — IBUPROFEN 600 MG PO TABS
600.0000 mg | ORAL_TABLET | Freq: Four times a day (QID) | ORAL | Status: DC
  Administered 2013-12-30 – 2014-01-01 (×8): 600 mg via ORAL
  Filled 2013-12-30 (×8): qty 1

## 2013-12-30 MED ORDER — LACTATED RINGERS IV BOLUS (SEPSIS)
500.0000 mL | Freq: Once | INTRAVENOUS | Status: AC
  Administered 2013-12-30: 500 mL via INTRAVENOUS

## 2013-12-30 MED ORDER — OXYTOCIN 10 UNIT/ML IJ SOLN
INTRAMUSCULAR | Status: AC
  Filled 2013-12-30: qty 4

## 2013-12-30 MED ORDER — 0.9 % SODIUM CHLORIDE (POUR BTL) OPTIME
TOPICAL | Status: DC | PRN
  Administered 2013-12-30: 1000 mL

## 2013-12-30 MED ORDER — WITCH HAZEL-GLYCERIN EX PADS: 1.0000 "application " | MEDICATED_PAD | CUTANEOUS | Status: DC | PRN

## 2013-12-30 MED ORDER — GENTAMICIN SULFATE 40 MG/ML IJ SOLN
160.0000 mg | Freq: Once | INTRAVENOUS | Status: AC
  Administered 2013-12-30: 160 mg via INTRAVENOUS
  Filled 2013-12-30: qty 4

## 2013-12-30 MED ORDER — PROMETHAZINE HCL 25 MG/ML IJ SOLN
INTRAMUSCULAR | Status: AC
  Filled 2013-12-30: qty 1

## 2013-12-30 MED ORDER — GENTAMICIN SULFATE 40 MG/ML IJ SOLN
150.0000 mg | Freq: Three times a day (TID) | INTRAMUSCULAR | Status: DC
  Filled 2013-12-30: qty 3.75

## 2013-12-30 MED ORDER — OXYTOCIN 10 UNIT/ML IJ SOLN
40.0000 [IU] | INTRAVENOUS | Status: DC | PRN
  Administered 2013-12-30: 40 [IU] via INTRAVENOUS

## 2013-12-30 MED ORDER — PRENATAL MULTIVITAMIN CH
1.0000 | ORAL_TABLET | Freq: Every day | ORAL | Status: DC
  Administered 2013-12-31 – 2014-01-01 (×2): 1 via ORAL
  Filled 2013-12-30 (×2): qty 1

## 2013-12-30 MED ORDER — METOCLOPRAMIDE HCL 5 MG/ML IJ SOLN
10.0000 mg | Freq: Three times a day (TID) | INTRAMUSCULAR | Status: DC | PRN
  Administered 2013-12-30: 10 mg via INTRAVENOUS

## 2013-12-30 MED ORDER — MORPHINE SULFATE (PF) 0.5 MG/ML IJ SOLN
INTRAMUSCULAR | Status: DC | PRN
  Administered 2013-12-30: 1 mg via INTRAVENOUS

## 2013-12-30 MED ORDER — ONDANSETRON HCL 4 MG PO TABS: 4.0000 mg | ORAL_TABLET | ORAL | Status: DC | PRN

## 2013-12-30 MED ORDER — TETANUS-DIPHTH-ACELL PERTUSSIS 5-2.5-18.5 LF-MCG/0.5 IM SUSP: 0.5000 mL | Freq: Once | INTRAMUSCULAR | Status: DC

## 2013-12-30 MED ORDER — MENTHOL 3 MG MT LOZG: 1.0000 | LOZENGE | OROMUCOSAL | Status: DC | PRN

## 2013-12-30 MED ORDER — NALOXONE HCL 0.4 MG/ML IJ SOLN: 0.4000 mg | INTRAMUSCULAR | Status: DC | PRN

## 2013-12-30 SURGICAL SUPPLY — 37 items
BARRIER ADHS 3X4 INTERCEED (GAUZE/BANDAGES/DRESSINGS) IMPLANT
BENZOIN TINCTURE PRP APPL 2/3 (GAUZE/BANDAGES/DRESSINGS) ×3 IMPLANT
CLAMP CORD UMBIL (MISCELLANEOUS) IMPLANT
CLOSURE WOUND 1/2 X4 (GAUZE/BANDAGES/DRESSINGS) ×1
CLOTH BEACON ORANGE TIMEOUT ST (SAFETY) ×3 IMPLANT
CONTAINER PREFILL 10% NBF 15ML (MISCELLANEOUS) IMPLANT
DRAPE LG THREE QUARTER DISP (DRAPES) IMPLANT
DRSG OPSITE POSTOP 4X10 (GAUZE/BANDAGES/DRESSINGS) ×3 IMPLANT
DURAPREP 26ML APPLICATOR (WOUND CARE) ×3 IMPLANT
ELECT REM PT RETURN 9FT ADLT (ELECTROSURGICAL) ×3
ELECTRODE REM PT RTRN 9FT ADLT (ELECTROSURGICAL) ×1 IMPLANT
EXTRACTOR VACUUM KIWI (MISCELLANEOUS) IMPLANT
GLOVE BIO SURGEON STRL SZ 6.5 (GLOVE) ×2 IMPLANT
GLOVE BIO SURGEONS STRL SZ 6.5 (GLOVE) ×1
GOWN STRL REUS W/TWL LRG LVL3 (GOWN DISPOSABLE) ×6 IMPLANT
KIT ABG SYR 3ML LUER SLIP (SYRINGE) IMPLANT
NEEDLE HYPO 25X5/8 SAFETYGLIDE (NEEDLE) IMPLANT
NEEDLE SPNL 18GX3.5 QUINCKE PK (NEEDLE) ×3 IMPLANT
NS IRRIG 1000ML POUR BTL (IV SOLUTION) ×3 IMPLANT
PACK C SECTION WH (CUSTOM PROCEDURE TRAY) ×3 IMPLANT
PAD OB MATERNITY 4.3X12.25 (PERSONAL CARE ITEMS) ×3 IMPLANT
STRIP CLOSURE SKIN 1/2X4 (GAUZE/BANDAGES/DRESSINGS) ×2 IMPLANT
SUT PDS AB 0 CTX 60 (SUTURE) IMPLANT
SUT VIC AB 0 CT1 27 (SUTURE)
SUT VIC AB 0 CT1 27XBRD ANBCTR (SUTURE) IMPLANT
SUT VIC AB 0 CT1 36 (SUTURE) IMPLANT
SUT VIC AB 2-0 CT1 27 (SUTURE) ×2
SUT VIC AB 2-0 CT1 TAPERPNT 27 (SUTURE) ×1 IMPLANT
SUT VIC AB 2-0 CTX 36 (SUTURE) ×6 IMPLANT
SUT VIC AB 3-0 CT1 27 (SUTURE) ×2
SUT VIC AB 3-0 CT1 TAPERPNT 27 (SUTURE) ×1 IMPLANT
SUT VIC AB 3-0 SH 27 (SUTURE)
SUT VIC AB 3-0 SH 27X BRD (SUTURE) IMPLANT
SYR 30ML LL (SYRINGE) ×3 IMPLANT
TOWEL OR 17X24 6PK STRL BLUE (TOWEL DISPOSABLE) ×3 IMPLANT
TRAY FOLEY CATH 14FR (SET/KITS/TRAYS/PACK) ×3 IMPLANT
WATER STERILE IRR 1000ML POUR (IV SOLUTION) ×3 IMPLANT

## 2013-12-30 NOTE — Lactation Note (Signed)
This note was copied from the chart of Connie Barbette Reichmann. Lactation Consultation Note Called to come to PACU by Nursery RN d/t can't get baby latched well, baby keeps pulling off and mom semi flat. Mom C/S w/nausea. RN states gets baby latched and will suck a couple of times and just holds nipple or pops off frequently. FOB keeps insisting baby is hungry, need to feed baby. I took a hand pump to give to mom. Mom w/emesis at this time, doesn't feel well. Explained to FOB that baby is resting quietly and will feed when mom is feeling better. Hand pump given to RN and will pass on to on coming nurse. Patient Name: Connie Newman Today's Date: 12/30/2013     Maternal Data    Feeding Feeding Type: Breast Fed Length of feed: 0 min  LATCH Score/Interventions Latch: Repeated attempts needed to sustain latch, nipple held in mouth throughout feeding, stimulation needed to elicit sucking reflex. Intervention(s): Adjust position;Assist with latch;Breast compression (when breast tissue compressed nipple goes flat)  Audible Swallowing: None Intervention(s): Skin to skin;Hand expression  Type of Nipple: Everted at rest and after stimulation  Comfort (Breast/Nipple): Soft / non-tender     Hold (Positioning): Full assist, staff holds infant at breast  [facility] Score: 5  Lactation Tools Discussed/Used     Consult Status      Charyl Dancer 12/30/2013, 6:11 AM

## 2013-12-30 NOTE — Lactation Note (Signed)
This note was copied from the chart of [name]. Lactation Consultation Note Follow up visit at 13 hours of age, with spanish interpreter Sharl Ma.  Mom was visited by lactation in PACU.  Baby is latched to right breast in football hold with wide flanged lips.  Rhythmic suckling bursts noted, but baby is sleepy and need stimulation to stay awake.  Instructed mom on how to unlatch baby and moved to left breast.  Nipple is erect, but flattens with compression.  Hand pump used and helps to evert nipple some.  Assisted and instructed on cross cradle hold.  Assistance needed to achieve deep latch.  Several swallows heard.  Baby maintained about 10 minutes of active suckling then sleepy again.  Discussed [facility] LC resources and feeding frequency.  Mom to call for assist as needed.   Patient Name: [name] Date: 12/30/2013 Reason for consult: Follow-up assessment   Maternal Data Has patient been taught Hand Expression?: Yes Does the patient have breastfeeding experience prior to this delivery?: No  Feeding Feeding Type: Breast Fed Length of feed: 10 min  LATCH Score/Interventions Latch: Repeated attempts needed to sustain latch, nipple held in mouth throughout feeding, stimulation needed to elicit sucking reflex. Intervention(s): Breast compression;Assist with latch  Audible Swallowing: A few with stimulation Intervention(s): Skin to skin;Hand expression  Type of Nipple: Flat Intervention(s): Hand pump  Comfort (Breast/Nipple): Soft / non-tender     Hold (Positioning): Assistance needed to correctly position infant at breast and maintain latch. Intervention(s): Breastfeeding basics reviewed;Support Pillows;Position options;Skin to skin  LATCH Score: 6  Lactation Tools Discussed/Used     Consult Status Consult Status: Follow-up Date: 12/31/13 Follow-up type: In-patient    Arvella Merles Shoptaw 12/30/2013, 5:41 PM

## 2013-12-30 NOTE — Transfer of Care (Signed)
Immediate Anesthesia Transfer of Care Note  Patient: Connie Newman  Procedure(s) Performed: Procedure(s): CESAREAN SECTION (N/A)  Patient Location: PACU  Anesthesia Type:Epidural  Level of Consciousness: awake, alert , oriented and patient cooperative  Airway & Oxygen Therapy: Patient Spontanous Breathing  Post-op Assessment: Report given to PACU RN and Post -op Vital signs reviewed and stable  Post vital signs: Reviewed and stable  Complications: No apparent anesthesia complications

## 2013-12-30 NOTE — Op Note (Signed)
12/29/2013 - 12/30/2013  4:20 AM  PATIENT:  Connie Newman  43 y.o. female  PRE-OPERATIVE DIAGNOSIS:  nonreassuring fetal heart rate  POST-OPERATIVE DIAGNOSIS:  nonreassuring fetal heart rate  PROCEDURE:  Procedure(s): CESAREAN SECTION (N/A)  FINDINGS: Living female infant, Apgars 9&9, intact meconium stained placenta, normal adnexa  SURGEON:  Surgeon(s) and Role:    * Allie Bossier, MD - Primary  PHYSICIAN ASSISTANT:   ASSISTANTS: none   ANESTHESIA:   epidural  EBL:  Total I/O In: 500 [I.V.:500] Out: 1100 [Urine:500; Blood:600]  BLOOD ADMINISTERED:none  DRAINS: none   LOCAL MEDICATIONS USED:  MARCAINE     SPECIMEN:  Source of Specimen:  cord blood  DISPOSITION OF SPECIMEN:  PATHOLOGY  COUNTS:  YES  TOURNIQUET:  * No tourniquets in log *  DICTATION: .Dragon Dictation  PLAN OF CARE: Admit to inpatient   PATIENT DISPOSITION:  PACU - hemodynamically stable.   Delay start of Pharmacological VTE agent (>24hrs) due to surgical blood loss or risk of bleeding: not applicable  The risks, benefits, and alternatives of surgery were explained, understood, accepted. Consents were signed. All questions were answered. Her epidural was bolused for surgery. In the OR her abdomen and vagina were prepped and draped in the usual sterile fashion. A Foley catheter had been placed, draining clear urine throughout most of the case. For about 1 minute after delivery of the baby, there was a blood tinge to the urine but this quickly cleared. Timeout procedure was done. After adequate anesthesia was assured, 30 mL of 0.5% Marcaine was injected into the subcutaneous tissue about 2 cm above the symphysis pubis. An incision was made there. The incision was carried down through the subcutaneous tissue to the fascia. The fascia was scored the midline and extended bilaterally. The middle 20% of the rectus muscles were separated in a transverse fashion using electrosurgical technique. Excellent  hemostasis was maintained. The peritoneum was entered with hemostats. Peritoneal incision was extended bilaterally with the Bovie. The bladder blade was placed. A transverse incision was made on the very well-developed lower uterine segment. The uterine incision was extended with traction on each side.  Meconium fluid was noted with amniotomy. The baby was delivered from a vertex lie with a OA presentation. The mouth and nostrils were suctioned prior to delivery of the shoulders.  The baby's cord was clamped and cut, and she was transferred to the NICU personnel for routine care. The placenta was delivered intact with traction. The uterus was left in situ and the interior was cleaned with a dry lap sponge. The uterine incision was closed with 2-0 Vicryl running locking suture in one layer. Excellent hemostasis was noted. By tilting the uterus each side was able to visualize the adnexa, and they were normal. The rectus fascia rectus muscles were noted be hemostatic as well. The fascia was closed with a 0 Vicryl suture in a running nonlocking fashion. No defects were palpable. The subcutaneous tissue was irrigated, clean, and dried. A subcuticular closure was done with a 3-0 Vicryl suture. Steri-Strips are placed. Excellent cosmetic results were obtained. She was taken to the recovery room in stable condition. She tolerated the procedure well.

## 2013-12-30 NOTE — Progress Notes (Signed)
   Connie Newman is a 43 y.o. G1P0 at [redacted]w[redacted]d  admitted for induction of labor due to [facility].  Subjective:   Objective: Filed Vitals:   12/30/13 0030 12/30/13 0100 12/30/13 0130 12/30/13 0200  BP: 144/77 129/78 138/76 135/70  Pulse: 72 95 77 81  Temp: 99.6 F (37.6 C)  100.3 F (37.9 C)   TempSrc:   Axillary   Resp: 18 18 18 18   Height:      Weight:      SpO2:          FHT:  FHR: 160 bpm, variability: marked,  accelerations:  Abscent,  decelerations:  Present variable/late UC:   regular, every 2 minutes SVE:   Dilation: 7 Effacement (%): 90 Station: 0 Exam by:: Drenda Freeze, CNM  Labs: Lab Results  Component Value Date   WBC 10.2 12/29/2013   HGB 13.3 12/29/2013   HCT 38.0 12/29/2013   MCV 90.7 12/29/2013   PLT 194 12/29/2013    Assessment / Plan: Induction of labor due to AMA,  progressing well on pitocin Chorioamnionitis Preeclampsia, mild  Labor: Progressing normally Fetal Wellbeing:  Category II Pain Control:  Epidural Anticipated MOD:  NSVD  Jacklyn Shell 12/30/2013, 2:16 AM

## 2013-12-30 NOTE — Progress Notes (Signed)
ANTIBIOTIC CONSULT NOTE - INITIAL  Pharmacy Consult for Gentamicin Indication: Chorioamnionitis   No Known Allergies  Patient Measurements: Height: 5' 1.42" (156 cm) Weight: 173 lb (78.472 kg) IBW/kg (Calculated) : 48.77 Adjusted Body Weight: 57 kg  Vital Signs: Temp: 100.3 F (37.9 C) (05/27 0130) Temp src: Axillary (05/27 0130) BP: 138/76 mmHg (05/27 0130) Pulse Rate: 77 (05/27 0130)  Labs:  Recent Labs  12/29/13 1235 12/29/13 1559  WBC 10.2  --   HGB 13.3  --   PLT 194  --   LABCREA  --  41.89  CREATININE 0.67  --    No results found for this basename: GENTTROUGH, GENTPEAK, GENTRANDOM,  in the last 72 hours   Microbiology: Recent Results (from the past 720 hour(s))  OB RESULTS CONSOLE GBS     Status: None   Collection Time    12/07/13 12:00 AM      Result Value Ref Range Status   GBS Negative   Final    Medications:  Ampicillin 2 grams Q 6 hours  Assessment: 43 y.o. female G1P0 at [redacted]w[redacted]d presented with vaginal bleeding and non reassuring fetal status.  IOL. Estimated Ke = 0.253, Vd = 0.4  Goal of Therapy:  Gentamicin peak 6-8 mg/L and Trough < 1 mg/L  Plan:  Gentamicin 160 mg IV x 1  Gentamicin 150 mg IV every 8 hrs  Check Scr with next labs if gentamicin continued. Will check gentamicin levels if continued > 72hr or clinically indicated.  Kerrigan Gombos S Tanyika Barros 12/30/2013,2:00 AM

## 2013-12-30 NOTE — Progress Notes (Signed)
ANTIBIOTIC CONSULT NOTE - INITIAL  Pharmacy Consult for : Zosyn Indication: Chorioamnionitis  No Known Allergies  Patient Measurements: Height: 5' 1.42" (156 cm) Weight: 173 lb (78.472 kg) IBW/kg (Calculated) : 48.77  Vital Signs: Temp: 98.7 F (37.1 C) (05/27 0730) Temp src: Axillary (05/27 0854) BP: 124/64 mmHg (05/27 0854) Pulse Rate: 76 (05/27 0854) Intake/Output from previous day: 05/26 0701 - 05/27 0700 In: 2000 [I.V.:2000] Out: 1275 [Urine:675; Blood:600] Intake/Output from this shift: Total I/O In: 200 [I.V.:200] Out: -   Labs:  Recent Labs  12/29/13 1235 12/29/13 1559  WBC 10.2  --   HGB 13.3  --   PLT 194  --   LABCREA  --  41.89  CREATININE 0.67  --    Estimated Creatinine Clearance: 88.7 ml/min (by C-G formula based on Cr of 0.67). No results found for this basename: VANCOTROUGH, Leodis Binet, VANCORANDOM, GENTTROUGH, GENTPEAK, GENTRANDOM, TOBRATROUGH, TOBRAPEAK, TOBRARND, AMIKACINPEAK, AMIKACINTROU, AMIKACIN,  in the last 72 hours   Microbiology: Recent Results (from the past 720 hour(s))  OB RESULTS CONSOLE GBS     Status: None   Collection Time    12/07/13 12:00 AM      Result Value Ref Range Status   GBS Negative   Final    Medical History: Past Medical History  Diagnosis Date  . 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  Assessment: For temp, chorioamnionitis   Goal of Therapy: Empiric coverage for infection.  Plan: Start Zosyn 3.375 g IV Q 8 hours for 24 hours.   Mercia Dowe Hovey-Rankin 12/30/2013,10:00 AM

## 2013-12-31 ENCOUNTER — Encounter (HOSPITAL_COMMUNITY): Payer: Self-pay | Admitting: Obstetrics & Gynecology

## 2013-12-31 ENCOUNTER — Inpatient Hospital Stay (HOSPITAL_COMMUNITY): Admission: RE | Admit: 2013-12-31 | Payer: Medicaid Other | Source: Ambulatory Visit

## 2013-12-31 LAB — CBC
HCT: 30.8 % — ABNORMAL LOW (ref 36.0–46.0)
Hemoglobin: 10.2 g/dL — ABNORMAL LOW (ref 12.0–15.0)
MCH: 30.9 pg (ref 26.0–34.0)
MCHC: 33.1 g/dL (ref 30.0–36.0)
MCV: 93.3 fL (ref 78.0–100.0)
PLATELETS: 167 10*3/uL (ref 150–400)
RBC: 3.3 MIL/uL — AB (ref 3.87–5.11)
RDW: 13.9 % (ref 11.5–15.5)
WBC: 16.2 10*3/uL — AB (ref 4.0–10.5)

## 2013-12-31 NOTE — H&P (Signed)
Attestation of Attending Supervision of Fellow: Evaluation and management procedures were performed by the Fellow under my supervision and collaboration.  I have reviewed the Fellow's note and chart, and I agree with the management and plan.    

## 2013-12-31 NOTE — Lactation Note (Signed)
This note was copied from the chart of Connie Barbette Reichmann. Lactation Consultation Note  Patient Name: Connie Newman [id number] Date: 12/31/2013 Reason for consult: Follow-up assessment of this mom and baby, now 43 hours postpartum.  Mom resting and has already been seen by several LC's since delivery but was experiencing nausea and vomiting initially.  LC spoke with RN, Helmut Muster, who is caring for this dyad tonight and states mom recently breastfed for 35 minutes and fed 7 ml's of formula for supplement (mom's choice).  Per Helmut Muster, RN, the baby had improving latch last night with LATCH score=9.  Per Helmut Muster, she was not able to observe the latch tonight but will assess later and encourage cue feedings at breast.   Maternal Data    Feeding Feeding Type: Breast Fed Length of feed: 35 min  LATCH Score/Interventions           most recent LATCH score=9           Lactation Tools Discussed/Used   Visit deferred but discussed assessment and plan with RN, Helmut Muster  Consult Status Consult Status: Follow-up Date: 01/01/14 Follow-up type: In-patient    Zara Chess 12/31/2013, 11:06 PM

## 2013-12-31 NOTE — Progress Notes (Signed)
Duplicate entered by student. Agree with note  Subjective: Postpartum Day 1: Cesarean Delivery Patient reports incisional pain, tolerating PO and no problems voiding.  Pt says she feels much better than yesterday 12/10/13 and that her vomiting resolved by 12 noon. No problems ambulating around the room. Complaining of not having any breast milk.  Objective: Vital signs in last 24 hours: Temp:  [97.8 F (36.6 C)-99.3 F (37.4 C)] 98.5 F (36.9 C) (05/28 0100) Pulse Rate:  [73-95] 85 (05/28 0500) Resp:  [16-20] 18 (05/28 0500) BP: (98-142)/(47-66) 106/57 mmHg (05/28 0500) SpO2:  [92 %-96 %] 96 % (05/28 0500)  Physical Exam:  General: alert, cooperative and no distress Lochia: appropriate Uterine Fundus: firm Incision: healing well, no significant drainage, no significant erythema DVT Evaluation: No evidence of DVT seen on physical exam. Negative Homan's sign. No cords or calf tenderness. No significant calf/ankle edema.   Recent Labs  12/29/13 1235 12/31/13 0645  HGB 13.3 10.2*  HCT 38.0 30.8*    Assessment/Plan: Status post Cesarean section. Doing well postoperatively.  Continue current care. Educated on breast feeding  Lawernce Pitts PA-S2 12/31/2013, 8:56 AM  I spoke with and examined patient and agree with PA-S's note and plan of care.  Tawana Scale, MD Ob Fellow 12/31/2013 9:19 AM

## 2013-12-31 NOTE — Progress Notes (Signed)
Subjective: Postpartum Day 1: Cesarean Delivery Patient reports incisional pain, tolerating PO, + flatus and + BM.   N/v yesterday resolved. Tolerated PO, ambulating, FOley removed this AM Objective: Vital signs in last 24 hours: Temp:  [97.8 F (36.6 C)-99.3 F (37.4 C)] 98.5 F (36.9 C) (05/28 0100) Pulse Rate:  [73-95] 85 (05/28 0500) Resp:  [16-20] 18 (05/28 0500) BP: (98-142)/(47-66) 106/57 mmHg (05/28 0500) SpO2:  [92 %-96 %] 96 % (05/28 0500)  Physical Exam:  General: alert, cooperative, appears stated age and no distress Lochia: appropriate Uterine Fundus: firm Incision: Serrous drainage, no complaints. DVT Evaluation: No evidence of DVT seen on physical exam. Negative Homan's sign. No cords or calf tenderness. No significant calf/ankle edema.   Recent Labs  12/29/13 1235 12/31/13 0645  HGB 13.3 10.2*  HCT 38.0 30.8*    Assessment/Plan: Status post Cesarean section. Doing well postoperatively.  Continue current care, Br/Bo feeding, POP for MOC, Complicated by Chorio and afebrile since delivery. Likely discharge tomorrow or day after pending patient status tomorrow.  Minta Balsam 12/31/2013, 8:57 AM

## 2013-12-31 NOTE — Progress Notes (Signed)
UR chart review completed.  

## 2013-12-31 NOTE — Progress Notes (Signed)
Patient up to shower, no complaints at this time.

## 2014-01-01 MED ORDER — IBUPROFEN 600 MG PO TABS: 600.0000 mg | ORAL_TABLET | Freq: Four times a day (QID) | ORAL | Status: DC

## 2014-01-01 MED ORDER — NORETHINDRONE 0.35 MG PO TABS: 1.0000 | ORAL_TABLET | Freq: Every day | ORAL | Status: DC

## 2014-01-01 MED ORDER — OXYCODONE-ACETAMINOPHEN 5-325 MG PO TABS: 1.0000 | ORAL_TABLET | ORAL | Status: DC | PRN

## 2014-01-01 NOTE — Discharge Summary (Signed)
Obstetric Discharge Summary Reason for Admission: induction of labor due to nonreassuring fetal heart tracing and vaginal bleeding Prenatal Procedures: NST Intrapartum Procedures: failed induction due to fetal intolerance of labor with resultant low transverse cesarean section Postpartum Procedures: none Complications-Operative and Postpartum: none Hemoglobin  Date Value Ref Range Status  12/31/2013 10.2* 12.0 - 15.0 g/dL Final     DELTA CHECK NOTED     REPEATED TO VERIFY     HCT  Date Value Ref Range Status  12/31/2013 30.8* 36.0 - 46.0 % Final    Physical Exam:  General: alert, cooperative and no distress Lochia: appropriate Uterine Fundus: firm Incision: healing well, honey comb dry. No erythema or drainage DVT Evaluation: No evidence of DVT seen on physical exam. No cords or calf tenderness. No significant calf/ankle edema.  Discharge Diagnoses: Term Pregnancy-delivered  Discharge Information: Date: 01/01/2014 Activity: pelvic rest Diet: routine Medications: PNV, Ibuprofen and micronor Condition: stable Instructions: refer to practice specific booklet Discharge to: home Follow-up Information   Follow up with [facility] In 6 weeks.   Contact information:   [address] [place] [address] [phone]      Newborn Data: Live born female  Birth Weight: 6 lb 4 oz (2835 g) APGAR: 9, 9  Home with mother.  Pt presented with vaginal bleeding and a non reassuring tracing and was admitted for induction of labor.  Due to fetal intolerance to labor, she ended up going to the operating room for a primary low transverse cesarean section. Her operating room course was uncomplicated and postpartum course was normal. She is breast feeding. She is being discharged on progesterone only pills.  Connie Newman [name] 01/01/2014, 9:49 AM

## 2014-01-01 NOTE — Discharge Instructions (Signed)
Parto por cesrea - Cuidados posteriores  (Cesarean Delivery, Care After) Siga estas instrucciones durante las prximas semanas. Estas indicaciones le proporcionan informacin general acerca de cmo deber cuidarse despus del procedimiento. El mdico tambin podr darle instrucciones ms especficas. El tratamiento se ha planificado de acuerdo a las prcticas mdicas actuales, pero a veces se producen problemas. Comunquese con el mdico si tiene algn problema o tiene dudas cuando vuelva a su casa.  INSTRUCCIONES PARA EL CUIDADO EN EL HOGAR  Tome slo medicamentos de venta libre o recetados, segn las indicaciones del mdico.  No beba alcohol, especialmente si est amamantando o toma analgsicos.  Nomastique tabaco ni fume.  Contine con un adecuado cuidado perineal. El buen cuidado perineal incluye:  Higienizarse de adelante hacia atrs.  Mantener la zona perineal limpia.  Controlar diariamente el corte (incisin) y observar si aumenta el enrojecimiento, si supura, se hincha o se separa la piel.  Limpie la incisin suavemente con jabn y agua todos los das, y luego squela dando golpecitos. Si el mdico la autoriza, deje la incisin al descubierto. Use un apsito (vendaje) si drena lquido o la incisin parece irritada. Si las pequeas tiras [facility] que cruzan la incisin no se caen dentro de los 7 das, retrelas suavemente.  Abrace una almohada al toser o estornudar hasta que la incisin se cure. Esto ayuda a Best boy.  No conduzca vehculos ni opere maquinarias hasta que el mdico la autorice.  Dchese, lvese el cabello y tome baos de inmersin segn las indicaciones de su mdico.  Utilice un sostn que le ajuste bien y que brinde buen soporte a sus Glass blower/designer.  Limite el uso de bombachas de sostn o medias panty.  Beba suficiente lquido para Consulting civil engineer orina clara o de color amarillo plido.  Consuma todos los das alimentos ricos en fibra como cereales y panes  [place], arroz, frijoles y frutas frescas y verduras. Estos alimentos pueden ayudarla a prevenir o Cytogeneticist.  Reanude las actividades como subir escaleras, conducir automviles, levantar objetos pesados, hacer ejercicios o viajar cuando le indique su mdico.  Hable con su mdico acerca de reanudar la actividad sexual. Volver a la actividad sexual depende del riesgo de infeccin, la velocidad de la curacin y la comodidad y su deseo de Financial controller.  Trate de que alguien la ayude con las actividades del hogar y con el recin nacido al menos durante algunos das despus de salir del hospital.  Descanse todo lo que pueda. Trate de descansar o tomar una siesta mientras el beb est durmiendo.  Aumente sus actividades gradualmente.  Cumpla con todos los controles programados para despus del [place]. Es muy importante asistir a todas las visitas de Nurse, adult. En estas visitas, su mdico va a controlarla para asegurarse de que est sanando fsica y emocionalmente. SOLICITE ATENCIN MDICA SI:   Elimina cogulos grandes por la vagina. Guarde algunos cogulos para mostrarle al mdico.  Tiene una secrecin con feo olor que proviene de la vagina.  Tiene dificultad para orinar.  Orina con frecuencia.  Siente dolor al [employer].  Nota un cambio en sus movimientos intestinales.  Aumenta el enrojecimiento, el dolor o la hinchazn en la zona de la incisin.  Observa que supura pus en la incisin.  La incisin se abre.  Sus [employer] duelen, estn duras o enrojecidas.  Sufre un dolor intenso de [place].  Tiene visin borrosa o ve manchas.  Se siente triste o deprimida.  Tiene pensamientos acerca de lastimarse o daar al  recin nacido.  Tiene preguntas acerca de su cuidado, la atencin del recin nacido o acerca de los medicamentos.  Se siente mareada o sufre un desmayo.  Tiene una erupcin.  Siente dolor u observa enrojecimiento o hinchazn en el sitio en que  estaba la va intravenosa (IV).  Tiene nuseas o vmitos.  Usted dej de amamantar al beb y no ha tenido su perodo menstrual dentro de las 12 semanas siguientes.  No amamanta al beb y no tuvo su perodo menstrual en las ltimas 12 semanas.  Tiene fiebre. SOLICITE ATENCIN MDICA DE INMEDIATO SI:   Siente dolor persistente.  Siente dolor en el pecho.  Le falta el aire.  Se desmaya.  Siente dolor en la pierna.  Siente Research scientist (life sciences).  El sangrado vaginal satura dos o ms apsitos en 1 hora. ASEGRESE DE QUE:   Comprende estas instrucciones.  Controlar su enfermedad.  Recibir ayuda de inmediato si no mejora o si empeora. Document Released: 07/23/2005 Document Revised: 03/25/2013 [facility] Patient Information 2014 [place], [place].

## 2014-01-04 NOTE — Progress Notes (Signed)
NST 12-18-13 reviewed and reactive 

## 2014-01-11 NOTE — Anesthesia Postprocedure Evaluation (Signed)
  Anesthesia Post-op Note  Patient: Connie Newman  Procedure(s) Performed: Procedure(s): CESAREAN SECTION (N/A)  Last Vitals:  Filed Vitals:   01/01/14 0603  BP: 116/67  Pulse: 73  Temp: 36.3 C  Resp: 17    Patient is awake, responsive, moving her legs, and has signs of resolution of her numbness. Pain and nausea are reasonably well controlled. Vital signs are stable and clinically acceptable. Oxygen saturation is clinically acceptable. There are no apparent anesthetic complications at this time. Patient is ready for discharge.

## 2014-06-07 ENCOUNTER — Encounter (HOSPITAL_COMMUNITY): Payer: Self-pay | Admitting: Obstetrics & Gynecology

## 2014-07-07 ENCOUNTER — Encounter: Payer: Self-pay | Admitting: Family Medicine

## 2015-06-02 LAB — OB RESULTS CONSOLE HIV ANTIBODY (ROUTINE TESTING): HIV: NONREACTIVE

## 2015-06-02 LAB — OB RESULTS CONSOLE RUBELLA ANTIBODY, IGM: Rubella: IMMUNE

## 2015-06-02 LAB — OB RESULTS CONSOLE ANTIBODY SCREEN: Antibody Screen: NEGATIVE

## 2015-06-02 LAB — OB RESULTS CONSOLE GC/CHLAMYDIA
CHLAMYDIA, DNA PROBE: NEGATIVE
Gonorrhea: NEGATIVE

## 2015-06-02 LAB — OB RESULTS CONSOLE ABO/RH: RH Type: POSITIVE

## 2015-06-02 LAB — OB RESULTS CONSOLE RPR: RPR: NONREACTIVE

## 2015-06-02 LAB — OB RESULTS CONSOLE HEPATITIS B SURFACE ANTIGEN: Hepatitis B Surface Ag: NEGATIVE

## 2015-11-20 ENCOUNTER — Inpatient Hospital Stay (HOSPITAL_COMMUNITY)
Admission: AD | Admit: 2015-11-20 | Discharge: 2015-11-20 | Disposition: A | Discharge status: Home / Self Care | Payer: Self-pay | Source: Ambulatory Visit | Attending: Obstetrics & Gynecology | Admitting: Obstetrics & Gynecology

## 2015-11-20 ENCOUNTER — Encounter (HOSPITAL_COMMUNITY): Payer: Self-pay | Admitting: *Deleted

## 2015-11-20 DIAGNOSIS — R112 Nausea with vomiting, unspecified: Secondary | ICD-10-CM | POA: Insufficient documentation

## 2015-11-20 DIAGNOSIS — O9989 Other specified diseases and conditions complicating pregnancy, childbirth and the puerperium: Secondary | ICD-10-CM

## 2015-11-20 DIAGNOSIS — R42 Dizziness and giddiness: Secondary | ICD-10-CM | POA: Insufficient documentation

## 2015-11-20 DIAGNOSIS — O26893 Other specified pregnancy related conditions, third trimester: Secondary | ICD-10-CM | POA: Insufficient documentation

## 2015-11-20 DIAGNOSIS — Z3A38 38 weeks gestation of pregnancy: Secondary | ICD-10-CM | POA: Insufficient documentation

## 2015-11-20 LAB — COMPREHENSIVE METABOLIC PANEL
ALBUMIN: 3 g/dL — AB (ref 3.5–5.0)
ALK PHOS: 170 U/L — AB (ref 38–126)
ALT: 15 U/L (ref 14–54)
ANION GAP: 8 (ref 5–15)
AST: 21 U/L (ref 15–41)
BUN: 11 mg/dL (ref 6–20)
CALCIUM: 8.6 mg/dL — AB (ref 8.9–10.3)
CO2: 22 mmol/L (ref 22–32)
Chloride: 106 mmol/L (ref 101–111)
Creatinine, Ser: 0.48 mg/dL (ref 0.44–1.00)
GFR calc Af Amer: >60 mL/min (ref >60)
GFR calc non Af Amer: >60 mL/min (ref >60)
GLUCOSE: 83 mg/dL (ref 65–99)
Potassium: 3.8 mmol/L (ref 3.5–5.1)
SODIUM: 136 mmol/L (ref 135–145)
Total Bilirubin: 0.5 mg/dL (ref 0.3–1.2)
Total Protein: 6.8 g/dL (ref 6.5–8.1)

## 2015-11-20 LAB — URINALYSIS, ROUTINE W REFLEX MICROSCOPIC
Bilirubin Urine: NEGATIVE
Glucose, UA: NEGATIVE mg/dL
Hgb urine dipstick: NEGATIVE
Ketones, ur: 15 mg/dL — AB
Nitrite: NEGATIVE
PROTEIN: NEGATIVE mg/dL
Specific Gravity, Urine: 1.02 (ref 1.005–1.030)
pH: 6 (ref 5.0–8.0)

## 2015-11-20 LAB — CBC
HCT: 36.4 % (ref 36.0–46.0)
HEMOGLOBIN: 12.4 g/dL (ref 12.0–15.0)
MCH: 30.2 pg (ref 26.0–34.0)
MCHC: 34.1 g/dL (ref 30.0–36.0)
MCV: 88.8 fL (ref 78.0–100.0)
Platelets: 196 10*3/uL (ref 150–400)
RBC: 4.1 MIL/uL (ref 3.87–5.11)
RDW: 13.5 % (ref 11.5–15.5)
WBC: 10.8 10*3/uL — AB (ref 4.0–10.5)

## 2015-11-20 LAB — URINE MICROSCOPIC-ADD ON

## 2015-11-20 NOTE — MAU Note (Signed)
Feeling dizzy, felt a little like this on Friday, but it went away.  Has vomited twice today.  Denies fever, diarrhea, headache or visual changes.

## 2015-11-20 NOTE — Progress Notes (Signed)
Assisted RN with discharge instructions.  Spanish Interpreter

## 2015-11-20 NOTE — MAU Provider Note (Signed)
History   [id number]   Chief Complaint  Patient presents with  . Dizziness  . Emesis    HPI Connie Newman is a 45 y.o. female  G2P1001 at 4418w0d IUP here with report of dizziness and 2 episodes of vomiting today.  Patient reports getting out of bed this AM and feeling dizzy.  Denies syncopal episode.  Began walking and felt nauseous and vomited, feeling subsided.  After laying in bed later felt same sensation when standing and vomited x 1 again.  Denies fever, body aches or chill or any other indication of recent illness.     No LMP recorded. Patient is pregnant.  OB History  Gravida Para Term Preterm AB SAB TAB Ectopic Multiple Living  2 1 1       1     # Outcome Date GA Lbr Len/2nd Weight Sex Delivery Anes PTL Lv  2 Current           1 Term 12/30/13 3972w6d  6 lb 4 oz (2.835 kg) F CS-LTranv EPI  Y      Past Medical History  Diagnosis Date  . 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 No pertinent family history.  Social History   Social History  . Marital Status: Single    Spouse Name: N/A  . Number of Children: N/A  . Years of Education: N/A   Social History Main Topics  . Smoking status: Never Smoker   . Smokeless tobacco: Never Used  . Alcohol Use: No  . Drug Use: None  . Sexual Activity: Yes    Birth Control/ Protection: None   Other Topics Concern  . None   Social History Narrative    No Known Allergies  No current facility-administered medications on file prior to encounter.   Current Outpatient Prescriptions on File Prior to Encounter  Medication Sig Dispense Refill  . Prenatal Vit-Fe Fumarate-FA (PRENATAL MULTIVITAMIN) TABS tablet Take 1 tablet by mouth daily at 12 noon.        Review of Systems  Constitutional: Negative for fever, chills, diaphoresis and appetite change.  Respiratory: Negative for shortness of breath.   Cardiovascular: Negative for chest pain and palpitations.   Gastrointestinal: Positive for nausea and vomiting. Negative for abdominal pain and constipation.  Neurological: Positive for dizziness, facial asymmetry (medical history) and light-headedness. Negative for syncope, speech difficulty and headaches.     Physical Exam   Filed Vitals:   11/20/15 1415  BP: 128/67  Pulse: 81  Temp: 98.1 F (36.7 C)  TempSrc: Oral  Resp: 18  Weight: 169 lb 12.8 oz (77.021 kg)  SpO2: 99%   Orthostatics - wnl  Physical Exam  Constitutional: She is oriented to person, place, and time. She appears well-developed and well-nourished.  HENT:  Head: Normocephalic.  Neck: Normal range of motion. Neck supple.  Cardiovascular: Normal rate and regular rhythm.  Exam reveals no gallop and no friction rub.   Murmur (Grade I SEM) heard. Respiratory: Effort normal and breath sounds normal. No respiratory distress. She has no wheezes.  GI: Soft. There is no tenderness.  Genitourinary: No bleeding in the vagina.  Musculoskeletal: Normal range of motion. She exhibits no edema.  Neurological: She is alert and oriented to person, place, and time.  Skin: Skin is warm and dry.    MAU Course  Procedures  Results for orders placed or performed during the hospital encounter of 11/20/15 (  from the past 24 hour(s))  Urinalysis, Routine w reflex microscopic (not at [facility])     Status: Abnormal   Collection Time: 11/20/15  2:20 PM  Result Value Ref Range   Color, Urine YELLOW YELLOW   APPearance CLEAR CLEAR   Specific Gravity, Urine 1.020 1.005 - 1.030   pH 6.0 5.0 - 8.0   Glucose, UA NEGATIVE NEGATIVE mg/dL   Hgb urine dipstick NEGATIVE NEGATIVE   Bilirubin Urine NEGATIVE NEGATIVE   Ketones, ur 15 (A) NEGATIVE mg/dL   Protein, ur NEGATIVE NEGATIVE mg/dL   Nitrite NEGATIVE NEGATIVE   Leukocytes, UA TRACE (A) NEGATIVE  Urine microscopic-add on     Status: Abnormal   Collection Time: 11/20/15  2:20 PM  Result Value Ref Range   Squamous Epithelial / LPF 6-30 (A) NONE  SEEN   WBC, UA 6-30 0 - 5 WBC/hpf   RBC / HPF 0-5 0 - 5 RBC/hpf   Bacteria, UA FEW (A) NONE SEEN   Urine-Other MUCOUS PRESENT   CBC     Status: Abnormal   Collection Time: 11/20/15  3:32 PM  Result Value Ref Range   WBC 10.8 (H) 4.0 - 10.5 K/uL   RBC 4.10 3.87 - 5.11 MIL/uL   Hemoglobin 12.4 12.0 - 15.0 g/dL   HCT 16.1 09.6 - 04.5 %   MCV 88.8 78.0 - 100.0 fL   MCH 30.2 26.0 - 34.0 pg   MCHC 34.1 30.0 - 36.0 g/dL   RDW 40.9 81.1 - 91.4 %   Platelets 196 150 - 400 K/uL  Comprehensive metabolic panel     Status: Abnormal (Preliminary result)   Collection Time: 11/20/15  3:32 PM  Result Value Ref Range   Sodium 136 135 - 145 mmol/L   Potassium 3.8 3.5 - 5.1 mmol/L   Chloride 106 101 - 111 mmol/L   CO2 22 22 - 32 mmol/L   Glucose, Bld 83 65 - 99 mg/dL   BUN 11 6 - 20 mg/dL   Creatinine, Ser 7.82 0.44 - 1.00 mg/dL   Calcium PENDING 8.9 - 10.3 mg/dL   Total Protein 6.8 6.5 - 8.1 g/dL   Albumin 3.0 (L) 3.5 - 5.0 g/dL   AST 21 15 - 41 U/L   ALT 15 14 - 54 U/L   Alkaline Phosphatase 170 (H) 38 - 126 U/L   Total Bilirubin 0.5 0.3 - 1.2 mg/dL   GFR calc non Af Amer >60 >60 mL/min   GFR calc Af Amer >60 >60 mL/min   Anion gap 8 5 - 15     Assessment and Plan  45 y.o. G2P1001 at [redacted]w[redacted]d IUP  Dizziness - Pregnancy Related Reactive NST  Plan: Increase fluids Frequent snacks Notify if worsening or happens more often   Marlis Edelson, CNM 11/20/2015 4:10 PM

## 2015-11-30 ENCOUNTER — Telehealth (HOSPITAL_COMMUNITY): Payer: Self-pay | Admitting: *Deleted

## 2015-11-30 ENCOUNTER — Encounter (HOSPITAL_COMMUNITY): Payer: Self-pay | Admitting: *Deleted

## 2015-11-30 LAB — OB RESULTS CONSOLE GBS: STREP GROUP B AG: NEGATIVE

## 2015-11-30 NOTE — Telephone Encounter (Signed)
Preadmission screen Interpreter number [id number]

## 2015-12-02 ENCOUNTER — Other Ambulatory Visit: Payer: Self-pay | Admitting: Advanced Practice Midwife

## 2015-12-04 ENCOUNTER — Inpatient Hospital Stay (HOSPITAL_COMMUNITY): Payer: Self-pay | Admitting: Anesthesiology

## 2015-12-04 ENCOUNTER — Encounter (HOSPITAL_COMMUNITY): Payer: Self-pay

## 2015-12-04 ENCOUNTER — Inpatient Hospital Stay (HOSPITAL_COMMUNITY)
Admission: RE | Admit: 2015-12-04 | Discharge: 2015-12-07 | DRG: 765 | Disposition: A | Discharge status: Home / Self Care | Payer: Self-pay | Source: Ambulatory Visit | Attending: Obstetrics and Gynecology | Admitting: Obstetrics and Gynecology

## 2015-12-04 DIAGNOSIS — Z302 Encounter for sterilization: Secondary | ICD-10-CM

## 2015-12-04 DIAGNOSIS — O34211 Maternal care for low transverse scar from previous cesarean delivery: Secondary | ICD-10-CM | POA: Diagnosis present

## 2015-12-04 DIAGNOSIS — Z98891 History of uterine scar from previous surgery: Secondary | ICD-10-CM

## 2015-12-04 DIAGNOSIS — O99354 Diseases of the nervous system complicating childbirth: Secondary | ICD-10-CM | POA: Diagnosis present

## 2015-12-04 DIAGNOSIS — O480 Post-term pregnancy: Secondary | ICD-10-CM | POA: Diagnosis present

## 2015-12-04 DIAGNOSIS — Z3A40 40 weeks gestation of pregnancy: Secondary | ICD-10-CM

## 2015-12-04 DIAGNOSIS — O09529 Supervision of elderly multigravida, unspecified trimester: Secondary | ICD-10-CM

## 2015-12-04 DIAGNOSIS — G8194 Hemiplegia, unspecified affecting left nondominant side: Secondary | ICD-10-CM | POA: Diagnosis present

## 2015-12-04 LAB — CBC
HEMATOCRIT: 35.2 % — AB (ref 36.0–46.0)
HEMOGLOBIN: 12 g/dL (ref 12.0–15.0)
MCH: 30.1 pg (ref 26.0–34.0)
MCHC: 34.1 g/dL (ref 30.0–36.0)
MCV: 88.2 fL (ref 78.0–100.0)
Platelets: 179 10*3/uL (ref 150–400)
RBC: 3.99 MIL/uL (ref 3.87–5.11)
RDW: 13.3 % (ref 11.5–15.5)
WBC: 8.9 10*3/uL (ref 4.0–10.5)

## 2015-12-04 LAB — TYPE AND SCREEN
ABO/RH(D): O POS
Antibody Screen: NEGATIVE

## 2015-12-04 LAB — RPR: RPR Ser Ql: NONREACTIVE

## 2015-12-04 MED ORDER — EPHEDRINE 5 MG/ML INJ
10.0000 mg | INTRAVENOUS | Status: DC | PRN
Start: 2015-12-04 — End: 2015-12-05

## 2015-12-04 MED ORDER — OXYTOCIN 10 UNIT/ML IJ SOLN: 1.0000 m[IU]/min | INTRAVENOUS | Status: DC

## 2015-12-04 MED ORDER — OXYCODONE-ACETAMINOPHEN 5-325 MG PO TABS: 2.0000 | ORAL_TABLET | ORAL | Status: DC | PRN

## 2015-12-04 MED ORDER — KETOROLAC TROMETHAMINE 30 MG/ML IJ SOLN: 30.0000 mg | Freq: Four times a day (QID) | INTRAMUSCULAR | Status: DC | PRN

## 2015-12-04 MED ORDER — OXYTOCIN 10 UNIT/ML IJ SOLN
1.0000 m[IU]/min | INTRAVENOUS | Status: DC
  Administered 2015-12-04: 2 m[IU]/min via INTRAVENOUS
  Filled 2015-12-04: qty 4

## 2015-12-04 MED ORDER — OXYCODONE-ACETAMINOPHEN 5-325 MG PO TABS: 1.0000 | ORAL_TABLET | ORAL | Status: DC | PRN

## 2015-12-04 MED ORDER — ACETAMINOPHEN 325 MG PO TABS: 650.0000 mg | ORAL_TABLET | ORAL | Status: DC | PRN

## 2015-12-04 MED ORDER — BUPIVACAINE HCL (PF) 0.5 % IJ SOLN
INTRAMUSCULAR | Status: AC
  Filled 2015-12-04: qty 30

## 2015-12-04 MED ORDER — FENTANYL CITRATE (PF) 100 MCG/2ML IJ SOLN
100.0000 ug | INTRAMUSCULAR | Status: DC | PRN
Start: 2015-12-04 — End: 2015-12-05
  Administered 2015-12-04: 100 ug via INTRAVENOUS
  Filled 2015-12-04: qty 2

## 2015-12-04 MED ORDER — LACTATED RINGERS IV SOLN
INTRAVENOUS | Status: DC
  Administered 2015-12-04 (×3): via INTRAVENOUS

## 2015-12-04 MED ORDER — PHENYLEPHRINE 40 MCG/ML (10ML) SYRINGE FOR IV PUSH (FOR BLOOD PRESSURE SUPPORT)
80.0000 ug | PREFILLED_SYRINGE | INTRAVENOUS | Status: DC | PRN
  Administered 2015-12-04: 80 ug via INTRAVENOUS

## 2015-12-04 MED ORDER — TERBUTALINE SULFATE 1 MG/ML IJ SOLN
0.2500 mg | Freq: Once | INTRAMUSCULAR | Status: AC | PRN
  Administered 2015-12-04: 0.25 mg via SUBCUTANEOUS
  Filled 2015-12-04: qty 1

## 2015-12-04 MED ORDER — CITRIC ACID-SODIUM CITRATE 334-500 MG/5ML PO SOLN
30.0000 mL | ORAL | Status: DC | PRN
  Administered 2015-12-04: 30 mL via ORAL
  Filled 2015-12-04: qty 15

## 2015-12-04 MED ORDER — LIDOCAINE HCL (PF) 1 % IJ SOLN: 30.0000 mL | INTRAMUSCULAR | Status: DC | PRN

## 2015-12-04 MED ORDER — FENTANYL CITRATE (PF) 100 MCG/2ML IJ SOLN
INTRAMUSCULAR | Status: DC | PRN
  Administered 2015-12-04: 100 ug via EPIDURAL

## 2015-12-04 MED ORDER — PHENYLEPHRINE 40 MCG/ML (10ML) SYRINGE FOR IV PUSH (FOR BLOOD PRESSURE SUPPORT)
80.0000 ug | PREFILLED_SYRINGE | INTRAVENOUS | Status: DC | PRN
  Filled 2015-12-04: qty 10

## 2015-12-04 MED ORDER — LACTATED RINGERS IV SOLN: 500.0000 mL | INTRAVENOUS | Status: DC | PRN

## 2015-12-04 MED ORDER — EPHEDRINE 5 MG/ML INJ: 10.0000 mg | INTRAVENOUS | Status: DC | PRN

## 2015-12-04 MED ORDER — DIPHENHYDRAMINE HCL 50 MG/ML IJ SOLN: 12.5000 mg | INTRAMUSCULAR | Status: DC | PRN

## 2015-12-04 MED ORDER — SODIUM CHLORIDE 0.9 % IV SOLN
10000.0000 ug | INTRAVENOUS | Status: DC | PRN
  Administered 2015-12-04: 80 ug via INTRAVENOUS
  Administered 2015-12-04: 40 ug via INTRAVENOUS

## 2015-12-04 MED ORDER — OXYTOCIN BOLUS FROM INFUSION: 500.0000 mL | INTRAVENOUS | Status: DC

## 2015-12-04 MED ORDER — LACTATED RINGERS IV SOLN: 500.0000 mL | Freq: Once | INTRAVENOUS | Status: DC

## 2015-12-04 MED ORDER — FENTANYL CITRATE (PF) 100 MCG/2ML IJ SOLN
INTRAMUSCULAR | Status: AC
Start: 2015-12-04 — End: 2015-12-04
  Filled 2015-12-04: qty 2

## 2015-12-04 MED ORDER — FENTANYL 2.5 MCG/ML BUPIVACAINE 1/10 % EPIDURAL INFUSION (WH - ANES)
14.0000 mL/h | INTRAMUSCULAR | Status: DC | PRN
  Administered 2015-12-04: 14 mL/h via EPIDURAL
  Filled 2015-12-04: qty 125

## 2015-12-04 MED ORDER — LIDOCAINE HCL (PF) 1 % IJ SOLN
INTRAMUSCULAR | Status: DC | PRN
  Administered 2015-12-04: 8 mL via EPIDURAL
  Administered 2015-12-04: 7 mL via EPIDURAL

## 2015-12-04 MED ORDER — SODIUM BICARBONATE 8.4 % IV SOLN
INTRAVENOUS | Status: DC | PRN
  Administered 2015-12-04: 3 mL via EPIDURAL
  Administered 2015-12-04 (×2): 5 mL via EPIDURAL
  Administered 2015-12-04: 2 mL via EPIDURAL

## 2015-12-04 MED ORDER — ONDANSETRON HCL 4 MG/2ML IJ SOLN: 4.0000 mg | Freq: Four times a day (QID) | INTRAMUSCULAR | Status: DC | PRN

## 2015-12-04 MED ORDER — MORPHINE SULFATE (PF) 0.5 MG/ML IJ SOLN
INTRAMUSCULAR | Status: AC
  Filled 2015-12-04: qty 10

## 2015-12-04 MED ORDER — LACTATED RINGERS IV SOLN: 2.5000 [IU]/h | INTRAVENOUS | Status: DC

## 2015-12-04 NOTE — Progress Notes (Signed)
Checked on patients needs. Also assisted RN with interpretation of pain control  Spanish interpreter

## 2015-12-04 NOTE — Progress Notes (Signed)
CSW received consult for medication assistance, which is not something CSW can assist with.  Please re-consult to the Care Management Department for assistance with medication.  CSW is screening out referral.

## 2015-12-04 NOTE — H&P (Signed)
Connie ReichmannBanesa Acevedo Newman is a 45 y.o. female presenting for IOL for AMA/TOLAC. History  Connie Newman is a 44yo G2P1001 at 40+0 who [facility] and [facility].  Her CS was reportedly due to NRFS during labor.  She denies contractions, bleeding, or LOF today. OB History    Gravida Para Term Preterm AB TAB SAB Ectopic Multiple Living   2 1 1       1      Past Medical History  Diagnosis Date  . 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 Anemia   . AMA (advanced maternal age) multigravida 35+    Past Surgical History  Procedure Laterality Date  . No past surgeries    . Cesarean section N/A 12/30/2013    Procedure: CESAREAN SECTION;  Surgeon: Allie BossierMyra C Dove, MD;  Location: [facility];  Service: Obstetrics;  Laterality: N/A;   Family History: family history is not on file. Social History:  reports that she has never smoked. She has never used smokeless tobacco. She reports that she does not drink alcohol or use illicit drugs.   Prenatal Transfer Tool  Maternal Diabetes: No Genetic Screening: Normal Maternal Ultrasounds/Referrals: Normal Fetal Ultrasounds or other Referrals:  Other: growth US 10/17/15 (33+2) with EFW 58%ile Maternal Substance Abuse:  No Significant Maternal Medications:  None Significant Maternal Lab Results:  Lab values include: Group B Strep negative Other Comments:  None  ROS  No fevers/chills No runny nose/sore throat No chest pain/SOB No nausea/vomiting No dysuria No rash   Dilation: 1 Effacement (%): 50 Station: Ballotable Exam by:: Genella RifeK. Booker, CNM Blood pressure 124/68, pulse 87, temperature 98.2 F (36.8 C), temperature source Oral, resp. rate 20, height 5' 1.42" (1.56 m), weight 171 lb (77.565 kg), unknown if currently breastfeeding. Exam Physical Exam  Gen: alert, in NAD HEENT: NCAT, normal conjunctivae, moist oral mucosa Chest: normal WOB, lungs CTAB CV: normal rate and regular rhythm, normal  S1 and S2, no m/r/g Abd: nontender Ext: no pedal edema Skin: no rashes or lesions noted Psych: cooperative, appropriate affect  FHTs: baseline 135bpm, moderate variability, +accels, -decels Toco: irregular uterine ctx Informal bedside US vertex per Joellyn HaffKim Booker, CNM Prenatal labs: ABO, Rh: O/Positive/-- (10/27 0000) Antibody: Negative (10/27 0000) Rubella: Immune (10/27 0000) RPR: Nonreactive (10/27 0000)  HBsAg: Negative (10/27 0000)  HIV: Non-reactive (10/27 0000)  GBS: Negative (04/26 0000)   Assessment/Plan: Connie Newman is a 44yo G2P1001 at 40+0 here for IOL for AMA/TOLAC.  # Labor: SVE 1/50/ballotable on admission.  Foley balloon placed at 09:15.  Plan for low-dose pitocin when Foley out. # FWB: Category 1 tracing, reassuring.  # Pain: Not currently planning epidural # ID: GBS neg, Rubella I # MOC: Considering IUD # MOF: breast # AMA: Quad normal, serial USs wnl   Connie Newman 12/04/2015, 9:21 AM

## 2015-12-04 NOTE — Progress Notes (Addendum)
Checked on patients needs.  Ordered Patients snacks.

## 2015-12-04 NOTE — Anesthesia Preprocedure Evaluation (Addendum)
Anesthesia Evaluation  Patient identified by MRN, date of birth, ID band Patient awake    Reviewed: Allergy & Precautions, H&P , NPO status , Patient's Chart, lab work & pertinent test results  Airway Mallampati: I  TM Distance: >3 FB Neck ROM: full    Dental no notable dental hx.    Pulmonary neg pulmonary ROS,    Pulmonary exam normal        Cardiovascular negative cardio ROS Normal cardiovascular exam     Neuro/Psych negative psych ROS   GI/Hepatic negative GI ROS, Neg liver ROS,   Endo/Other  negative endocrine ROS  Renal/GU negative Renal ROS     Musculoskeletal   Abdominal (+) + obese,   Peds  Hematology negative hematology ROS (+)   Anesthesia Other Findings   Reproductive/Obstetrics (+) Pregnancy                             Anesthesia Physical Anesthesia Plan  ASA: II  Anesthesia Plan: Epidural   Post-op Pain Management:    Induction:   Airway Management Planned:   Additional Equipment:   Intra-op Plan:   Post-operative Plan:   Informed Consent: I have reviewed the patients History and Physical, chart, labs and discussed the procedure including the risks, benefits and alternatives for the proposed anesthesia with the patient or authorized representative who has indicated his/her understanding and acceptance.     Plan Discussed with:   Anesthesia Plan Comments: (For C/S with working epidural.)       Anesthesia Quick Evaluation

## 2015-12-04 NOTE — Progress Notes (Signed)
Assisted with interpretation during procedure of epidural with RN and Anaesthesiologist.  Spanish Interpreter

## 2015-12-04 NOTE — Consult Note (Signed)
Neonatology Note:   Attendance at C-section:    I was asked by Dr. Emelda FearFerguson to attend this repeat C/S at term due to fetal intolerance to labor. The mother is a G2P1 O pos, GBS neg with failed TOLAC, otherwise uncomplicated pregnancy. ROM 4 hours prior to delivery, fluid clear. She got a dose of IV Fentanyl 3 hours prior to delivery. Infant was floppy and did not breathe/cry spontaneously at birth. We bulb suctioned, then applied PPV with the neopuff for about 1.5 minutes. HR was normal throughout and color improved with bagging. At about 2 minutes, he began to cry, but did not sustain the respiratory effort. We gave 1 ml Narcan to the LAT at 4 minutes of life, and he responded very well to it, with vigorous crying and improved respiratory effort thereafter. Ap 3/9. Lungs clear to ausc in DR. To CN to care of Pediatrician.   Connie Souhristie C. Rayshad Riviello, MD

## 2015-12-04 NOTE — Progress Notes (Signed)
Connie Newman is a 45 y.o. G2P1001 at 1310w0d by ultrasound admitted for induction of labor [facility].  Subjective:   Objective: BP 140/72 mmHg  Pulse 88  Temp(Src) 98.2 F (36.8 C) (Oral)  Resp 16  Ht 5' 1.42" (1.56 m)  Wt 171 lb (77.565 kg)  BMI 31.87 kg/m2      FHT:  FHR: 140's bpm, variability: moderate,  accelerations:  Present,  decelerations:  Absent UC:   regular, every 2-3 minutes SVE:   Dilation: 4 Effacement (%): 50 Station: -2 Exam by:: L.Stubbs, RN  Labs: Lab Results  Component Value Date   WBC 8.9 12/04/2015   HGB 12.0 12/04/2015   HCT 35.2* 12/04/2015   MCV 88.2 12/04/2015   PLT 179 12/04/2015    Assessment / Plan: Induction of labor due to AMA,  progressing well on pitocin  Labor: Progressing normally Preeclampsia:  no signs or symptoms of toxicity and intake and ouput balanced Fetal Wellbeing:  Category I Pain Control:  Epidural I/D:  n/a Anticipated MOD:  NSVD  Connie Newman 12/04/2015, 8:52 PM

## 2015-12-04 NOTE — Progress Notes (Signed)
Connie Newman is a 45 y.o. G2P1001 at 3643w0d by LMP admitted for induction of labor due to AMA at 40 wk..  Subjective: Pt has developed repetetive decels with contractions, Cat II, and with pushing there is minimal descent of the vertex, and worsening of the decels.  Objective: BP 131/68 mmHg  Pulse 93  Temp(Src) 97.6 F (36.4 C) (Oral)  Resp 16  Ht 5' 1.42" (1.56 m)  Wt 77.565 kg (171 lb)  BMI 31.87 kg/m2      FHT:  FHR: 160 bpm, variability: moderate,  accelerations:  Present,  decelerations:  Present cat II UC:   regular, every 2 minutes SVE:   Dilation: 10 Effacement (%): 90 Station: +1 Exam by:: Dr.Cebastian Neis Station 0 to +1, not a candidate for vaginal operative delivery jvf Labs: Lab Results  Component Value Date   WBC 8.9 12/04/2015   HGB 12.0 12/04/2015   HCT 35.2* 12/04/2015   MCV 88.2 12/04/2015   PLT 179 12/04/2015    Assessment / Plan: Arrest in active phase of labor, fetal intolerance of labor  Labor: pitocin off Preeclampsia:  labs stable Fetal Wellbeing:  Category II Pain Control:  Epidural I/D:  n/a Anticipated MOD:  Cesarean recommended thru interpreter, consented, explained , pt accepts recommendation.  Connie Newman V 12/04/2015, 11:32 PM

## 2015-12-04 NOTE — Progress Notes (Signed)
LABOR PROGRESS NOTE  Barbette ReichmannBanesa Acevedo Gonzalez is a 45 y.o. G2P1001 at 5322w0d  admitted for IOL for AMA/TOLAC.  Subjective: Foley out approximately 13:30.  Pitocin started.  Patient beginning to feel some contractions but not terribly uncomfortable yet.  Objective: BP 136/67 mmHg  Pulse 81  Temp(Src) 98.1 F (36.7 C) (Oral)  Resp 18  Ht 5' 1.42" (1.56 m)  Wt 171 lb (77.565 kg)  BMI 31.87 kg/m2 or  Filed Vitals:   12/04/15 0743 12/04/15 0804 12/04/15 1325  BP: 124/68  136/67  Pulse: 87  81  Temp: 98.2 F (36.8 C)  98.1 F (36.7 C)  TempSrc: Oral  Oral  Resp: 20  18  Height:  5' 1.42" (1.56 m)   Weight:  171 lb (77.565 kg)     Gen: alert, sitting up, in NAD Chest: normal WOB  Dilation: 4 Effacement (%): 50 Station: Ballotable Presentation: Vertex Exam by:: Camelia EngK. Haynes, RN/B. Katrinka BlazingSmith, RN  Labs: Lab Results  Component Value Date   WBC 8.9 12/04/2015   HGB 12.0 12/04/2015   HCT 35.2* 12/04/2015   MCV 88.2 12/04/2015   PLT 179 12/04/2015    Patient Active Problem List   Diagnosis Date Noted  . Post term pregnancy 12/04/2015  . Non-reassuring fetal status, delivered, current hospitalization 12/29/2013  . AMA (advanced maternal age) multigravida - age > 1540 12/12/2013    Assessment / Plan: 45 y.o. G2P1001 at 4422w0d here for IOL for AMA/TOLAC.  Labor: On pitocin and beginning to feel some contractions. Fetal Wellbeing:  Category 1 tracing Pain Control:  None currently Anticipated MOD:  Anticipate SVD  Caesar ChestnutKelly E Eleri Ruben, MD 12/04/2015, 3:42 PM

## 2015-12-04 NOTE — Anesthesia Procedure Notes (Signed)
Epidural Patient location during procedure: OB Start time: 12/04/2015 9:19 PM End time: 12/04/2015 9:23 PM  Staffing Anesthesiologist: Leilani AbleHATCHETT, Arshad Oberholzer Performed by: anesthesiologist   Preanesthetic Checklist Completed: patient identified, surgical consent, pre-op evaluation, timeout performed, IV checked, risks and benefits discussed and monitors and equipment checked  Epidural Patient position: sitting Prep: site prepped and draped and DuraPrep Patient monitoring: continuous pulse ox and blood pressure Approach: midline Location: L3-L4 Injection technique: LOR air  Needle:  Needle type: Tuohy  Needle gauge: 17 G Needle length: 9 cm and 9 Needle insertion depth: 5 cm cm Catheter type: closed end flexible Catheter size: 19 Gauge Catheter at skin depth: 9 cm Test dose: negative and Other  Assessment Sensory level: T9 Events: blood not aspirated, injection not painful, no injection resistance, negative IV test and no paresthesia  Additional Notes Reason for block:procedure for pain

## 2015-12-04 NOTE — Progress Notes (Signed)
Patient ID: Connie Newman, female   DOB: 03/10/1971, 45 y.o.   MRN: [id number] 44yo G2P1001 at 5718w0d here for IOL d/t AMA, also a TOLAC- prev c/s for [facility] LLCNRFHR w/ 1 layer closure, desires VBAC.  Good fm, denies vb, lof, uc's.   O: Blood pressure 124/68, pulse 87, temperature 98.2 F (36.8 C), temperature source Oral, resp. rate 20, height 5' 1.42" (1.56 m), weight 77.565 kg (171 lb), unknown if currently breastfeeding.  SVE: 1/50/ballotable, unable to determine presentation, informal bs u/s confirms vtx  A: G2P1001 1618w0d SIUP IOL for AMA, also wants TOLAC  P: Oncoming shift to place cervical foley bulb for ripening, process explained to pt.   Cheral MarkerKimberly R. Merit Gadsby, CNM, [facility]NP-BC 12/04/2015 8:38 AM

## 2015-12-05 ENCOUNTER — Encounter (HOSPITAL_COMMUNITY)
Admission: RE | Disposition: A | Discharge status: Home / Self Care | Payer: Self-pay | Source: Ambulatory Visit | Attending: Obstetrics and Gynecology

## 2015-12-05 ENCOUNTER — Encounter (HOSPITAL_COMMUNITY): Payer: Self-pay

## 2015-12-05 DIAGNOSIS — G8194 Hemiplegia, unspecified affecting left nondominant side: Secondary | ICD-10-CM

## 2015-12-05 DIAGNOSIS — Z302 Encounter for sterilization: Secondary | ICD-10-CM

## 2015-12-05 DIAGNOSIS — O480 Post-term pregnancy: Secondary | ICD-10-CM

## 2015-12-05 DIAGNOSIS — Z3A40 40 weeks gestation of pregnancy: Secondary | ICD-10-CM

## 2015-12-05 DIAGNOSIS — O34211 Maternal care for low transverse scar from previous cesarean delivery: Secondary | ICD-10-CM

## 2015-12-05 DIAGNOSIS — O99354 Diseases of the nervous system complicating childbirth: Secondary | ICD-10-CM

## 2015-12-05 LAB — CBC
HCT: 31.6 % — ABNORMAL LOW (ref 36.0–46.0)
HEMOGLOBIN: 10.7 g/dL — AB (ref 12.0–15.0)
MCH: 30 pg (ref 26.0–34.0)
MCHC: 33.9 g/dL (ref 30.0–36.0)
MCV: 88.5 fL (ref 78.0–100.0)
PLATELETS: 174 10*3/uL (ref 150–400)
RBC: 3.57 MIL/uL — AB (ref 3.87–5.11)
RDW: 13.5 % (ref 11.5–15.5)
WBC: 16.3 10*3/uL — AB (ref 4.0–10.5)

## 2015-12-05 SURGERY — Surgical Case
Anesthesia: Epidural

## 2015-12-05 MED ORDER — OXYTOCIN 10 UNIT/ML IJ SOLN
INTRAMUSCULAR | Status: AC
  Filled 2015-12-05: qty 4

## 2015-12-05 MED ORDER — IBUPROFEN 600 MG PO TABS: 600.0000 mg | ORAL_TABLET | Freq: Four times a day (QID) | ORAL | Status: DC | PRN

## 2015-12-05 MED ORDER — HYDROMORPHONE HCL 1 MG/ML IJ SOLN: 0.2500 mg | INTRAMUSCULAR | Status: DC | PRN

## 2015-12-05 MED ORDER — HYDROXYZINE HCL 50 MG/ML IM SOLN
50.0000 mg | Freq: Once | INTRAMUSCULAR | Status: AC
  Administered 2015-12-05: 50 mg via INTRAMUSCULAR
  Filled 2015-12-05: qty 1

## 2015-12-05 MED ORDER — WITCH HAZEL-GLYCERIN EX PADS: 1.0000 "application " | MEDICATED_PAD | CUTANEOUS | Status: DC | PRN

## 2015-12-05 MED ORDER — ONDANSETRON HCL 4 MG/2ML IJ SOLN
INTRAMUSCULAR | Status: AC
  Filled 2015-12-05: qty 2

## 2015-12-05 MED ORDER — HYDROXYZINE HCL 50 MG/ML IM SOLN
50.0000 mg | Freq: Four times a day (QID) | INTRAMUSCULAR | Status: DC | PRN
  Filled 2015-12-05: qty 1

## 2015-12-05 MED ORDER — MEPERIDINE HCL 25 MG/ML IJ SOLN
INTRAMUSCULAR | Status: AC
  Filled 2015-12-05: qty 1

## 2015-12-05 MED ORDER — ACETAMINOPHEN 325 MG PO TABS: 650.0000 mg | ORAL_TABLET | ORAL | Status: DC | PRN

## 2015-12-05 MED ORDER — SCOPOLAMINE 1 MG/3DAYS TD PT72: 1.0000 | MEDICATED_PATCH | Freq: Once | TRANSDERMAL | Status: DC

## 2015-12-05 MED ORDER — PRENATAL MULTIVITAMIN CH
1.0000 | ORAL_TABLET | Freq: Every day | ORAL | Status: DC
  Administered 2015-12-05 – 2015-12-07 (×3): 1 via ORAL
  Filled 2015-12-05 (×3): qty 1

## 2015-12-05 MED ORDER — MEPERIDINE HCL 25 MG/ML IJ SOLN: 6.2500 mg | INTRAMUSCULAR | Status: DC | PRN

## 2015-12-05 MED ORDER — SCOPOLAMINE 1 MG/3DAYS TD PT72
MEDICATED_PATCH | TRANSDERMAL | Status: DC | PRN
  Administered 2015-12-04: 1 via TRANSDERMAL

## 2015-12-05 MED ORDER — SIMETHICONE 80 MG PO CHEW
80.0000 mg | CHEWABLE_TABLET | Freq: Three times a day (TID) | ORAL | Status: DC
  Administered 2015-12-05 – 2015-12-07 (×5): 80 mg via ORAL
  Filled 2015-12-05 (×5): qty 1

## 2015-12-05 MED ORDER — SODIUM CHLORIDE 0.9% FLUSH: 3.0000 mL | INTRAVENOUS | Status: DC | PRN

## 2015-12-05 MED ORDER — PHENYLEPHRINE 8 MG IN D5W 100 ML (0.08MG/ML) PREMIX OPTIME
INJECTION | INTRAVENOUS | Status: AC
  Filled 2015-12-05: qty 100

## 2015-12-05 MED ORDER — ACETAMINOPHEN 500 MG PO TABS
1000.0000 mg | ORAL_TABLET | Freq: Four times a day (QID) | ORAL | Status: AC
  Administered 2015-12-05 – 2015-12-06 (×3): 1000 mg via ORAL
  Filled 2015-12-05 (×3): qty 2

## 2015-12-05 MED ORDER — NALBUPHINE HCL 10 MG/ML IJ SOLN: 5.0000 mg | INTRAMUSCULAR | Status: DC | PRN

## 2015-12-05 MED ORDER — SIMETHICONE 80 MG PO CHEW: 80.0000 mg | CHEWABLE_TABLET | ORAL | Status: DC | PRN

## 2015-12-05 MED ORDER — NALBUPHINE HCL 10 MG/ML IJ SOLN: 5.0000 mg | Freq: Once | INTRAMUSCULAR | Status: DC | PRN

## 2015-12-05 MED ORDER — OXYCODONE-ACETAMINOPHEN 5-325 MG PO TABS: 2.0000 | ORAL_TABLET | ORAL | Status: DC | PRN

## 2015-12-05 MED ORDER — DIPHENHYDRAMINE HCL 25 MG PO CAPS: 25.0000 mg | ORAL_CAPSULE | Freq: Four times a day (QID) | ORAL | Status: DC | PRN

## 2015-12-05 MED ORDER — OXYCODONE-ACETAMINOPHEN 5-325 MG PO TABS: 1.0000 | ORAL_TABLET | ORAL | Status: DC | PRN

## 2015-12-05 MED ORDER — SENNOSIDES-DOCUSATE SODIUM 8.6-50 MG PO TABS
2.0000 | ORAL_TABLET | ORAL | Status: DC
  Administered 2015-12-06 (×2): 2 via ORAL
  Filled 2015-12-05 (×3): qty 2

## 2015-12-05 MED ORDER — NALOXONE HCL 0.4 MG/ML IJ SOLN: 0.4000 mg | INTRAMUSCULAR | Status: DC | PRN

## 2015-12-05 MED ORDER — SCOPOLAMINE 1 MG/3DAYS TD PT72
MEDICATED_PATCH | TRANSDERMAL | Status: AC
  Filled 2015-12-05: qty 1

## 2015-12-05 MED ORDER — COCONUT OIL OIL: 1.0000 "application " | TOPICAL_OIL | Status: DC | PRN

## 2015-12-05 MED ORDER — ONDANSETRON HCL 4 MG/2ML IJ SOLN
4.0000 mg | Freq: Three times a day (TID) | INTRAMUSCULAR | Status: DC | PRN
  Filled 2015-12-05: qty 2

## 2015-12-05 MED ORDER — PROMETHAZINE HCL 25 MG/ML IJ SOLN: 12.5000 mg | INTRAMUSCULAR | Status: DC | PRN

## 2015-12-05 MED ORDER — ONDANSETRON HCL 4 MG/2ML IJ SOLN
4.0000 mg | Freq: Once | INTRAMUSCULAR | Status: AC
  Administered 2015-12-05: 4 mg via INTRAVENOUS

## 2015-12-05 MED ORDER — OXYTOCIN 10 UNIT/ML IJ SOLN
2.5000 [IU]/h | INTRAMUSCULAR | Status: AC
Start: 2015-12-05 — End: 2015-12-05
  Administered 2015-12-05: 2.5 [IU]/h via INTRAVENOUS
  Filled 2015-12-05: qty 4

## 2015-12-05 MED ORDER — ZOLPIDEM TARTRATE 5 MG PO TABS: 5.0000 mg | ORAL_TABLET | Freq: Every evening | ORAL | Status: DC | PRN

## 2015-12-05 MED ORDER — OXYTOCIN 10 UNIT/ML IJ SOLN
40.0000 [IU] | INTRAMUSCULAR | Status: DC | PRN
  Administered 2015-12-05: 40 [IU] via INTRAVENOUS

## 2015-12-05 MED ORDER — NALOXONE HCL 2 MG/2ML IJ SOSY
1.0000 ug/kg/h | PREFILLED_SYRINGE | INTRAVENOUS | Status: DC | PRN
  Filled 2015-12-05: qty 2

## 2015-12-05 MED ORDER — NALBUPHINE HCL 10 MG/ML IJ SOLN
5.0000 mg | INTRAMUSCULAR | Status: DC | PRN
  Administered 2015-12-05: 5 mg via INTRAVENOUS
  Filled 2015-12-05: qty 1

## 2015-12-05 MED ORDER — MENTHOL 3 MG MT LOZG: 1.0000 | LOZENGE | OROMUCOSAL | Status: DC | PRN

## 2015-12-05 MED ORDER — NALOXONE HCL 0.4 MG/ML IJ SOLN
INTRAMUSCULAR | Status: AC
  Filled 2015-12-05: qty 1

## 2015-12-05 MED ORDER — KETOROLAC TROMETHAMINE 30 MG/ML IJ SOLN
30.0000 mg | Freq: Once | INTRAMUSCULAR | Status: DC
Start: 2015-12-05 — End: 2015-12-05

## 2015-12-05 MED ORDER — DIBUCAINE 1 % RE OINT: 1.0000 "application " | TOPICAL_OINTMENT | RECTAL | Status: DC | PRN

## 2015-12-05 MED ORDER — DIPHENHYDRAMINE HCL 50 MG/ML IJ SOLN: 12.5000 mg | INTRAMUSCULAR | Status: DC | PRN

## 2015-12-05 MED ORDER — MEPERIDINE HCL 25 MG/ML IJ SOLN
6.2500 mg | INTRAMUSCULAR | Status: DC | PRN
  Administered 2015-12-05: 6.25 mg via INTRAVENOUS

## 2015-12-05 MED ORDER — TETANUS-DIPHTH-ACELL PERTUSSIS 5-2.5-18.5 LF-MCG/0.5 IM SUSP: 0.5000 mL | Freq: Once | INTRAMUSCULAR | Status: DC

## 2015-12-05 MED ORDER — ONDANSETRON HCL 4 MG/2ML IJ SOLN
INTRAMUSCULAR | Status: DC | PRN
  Administered 2015-12-05: 4 mg via INTRAVENOUS

## 2015-12-05 MED ORDER — ONDANSETRON HCL 4 MG/2ML IJ SOLN: 4.0000 mg | Freq: Once | INTRAMUSCULAR | Status: DC | PRN

## 2015-12-05 MED ORDER — LACTATED RINGERS IV SOLN: INTRAVENOUS | Status: DC

## 2015-12-05 MED ORDER — DIPHENHYDRAMINE HCL 25 MG PO CAPS
25.0000 mg | ORAL_CAPSULE | ORAL | Status: DC | PRN
  Administered 2015-12-05: 25 mg via ORAL
  Filled 2015-12-05: qty 1

## 2015-12-05 MED ORDER — IBUPROFEN 600 MG PO TABS
600.0000 mg | ORAL_TABLET | Freq: Four times a day (QID) | ORAL | Status: DC
  Administered 2015-12-05 – 2015-12-07 (×9): 600 mg via ORAL
  Filled 2015-12-05 (×9): qty 1

## 2015-12-05 MED ORDER — PHENYLEPHRINE 40 MCG/ML (10ML) SYRINGE FOR IV PUSH (FOR BLOOD PRESSURE SUPPORT)
PREFILLED_SYRINGE | INTRAVENOUS | Status: AC
  Filled 2015-12-05: qty 10

## 2015-12-05 MED ORDER — PHENYLEPHRINE 8 MG IN D5W 100 ML (0.08MG/ML) PREMIX OPTIME
INJECTION | INTRAVENOUS | Status: DC | PRN
  Administered 2015-12-05: 60 ug/min via INTRAVENOUS

## 2015-12-05 MED ORDER — SIMETHICONE 80 MG PO CHEW
80.0000 mg | CHEWABLE_TABLET | ORAL | Status: DC
  Administered 2015-12-06 (×2): 80 mg via ORAL
  Filled 2015-12-05 (×2): qty 1

## 2015-12-05 SURGICAL SUPPLY — 34 items
BENZOIN TINCTURE PRP APPL 2/3 (GAUZE/BANDAGES/DRESSINGS) IMPLANT
CHLORAPREP W/TINT 26ML (MISCELLANEOUS) ×2 IMPLANT
CLAMP CORD UMBIL (MISCELLANEOUS) IMPLANT
CLIP FILSHIE TUBAL LIGA STRL (Clip) ×2 IMPLANT
CLOTH BEACON ORANGE TIMEOUT ST (SAFETY) ×2 IMPLANT
DRSG OPSITE POSTOP 4X10 (GAUZE/BANDAGES/DRESSINGS) ×2 IMPLANT
ELECT REM PT RETURN 9FT ADLT (ELECTROSURGICAL) ×2
ELECTRODE REM PT RTRN 9FT ADLT (ELECTROSURGICAL) ×1 IMPLANT
EXTRACTOR VACUUM KIWI (MISCELLANEOUS) IMPLANT
GLOVE BIO SURGEON ST LM GN SZ9 (GLOVE) ×2 IMPLANT
GLOVE BIOGEL PI IND STRL 7.0 (GLOVE) ×2 IMPLANT
GLOVE BIOGEL PI IND STRL 9 (GLOVE) ×1 IMPLANT
GLOVE BIOGEL PI INDICATOR 7.0 (GLOVE) ×2
GLOVE BIOGEL PI INDICATOR 9 (GLOVE) ×1
GOWN STRL REUS W/TWL 2XL LVL3 (GOWN DISPOSABLE) ×2 IMPLANT
GOWN STRL REUS W/TWL LRG LVL3 (GOWN DISPOSABLE) ×2 IMPLANT
NEEDLE HYPO 25X5/8 SAFETYGLIDE (NEEDLE) IMPLANT
NS IRRIG 1000ML POUR BTL (IV SOLUTION) ×2 IMPLANT
PACK C SECTION WH (CUSTOM PROCEDURE TRAY) ×2 IMPLANT
PAD OB MATERNITY 4.3X12.25 (PERSONAL CARE ITEMS) ×2 IMPLANT
PENCIL SMOKE EVAC W/HOLSTER (ELECTROSURGICAL) ×2 IMPLANT
RTRCTR C-SECT PINK 25CM LRG (MISCELLANEOUS) IMPLANT
RTRCTR C-SECT PINK 34CM XLRG (MISCELLANEOUS) IMPLANT
STRIP CLOSURE SKIN 1/2X4 (GAUZE/BANDAGES/DRESSINGS) IMPLANT
SUT MNCRL 0 VIOLET CTX 36 (SUTURE) ×2 IMPLANT
SUT MONOCRYL 0 CTX 36 (SUTURE) ×2
SUT VIC AB 0 CT1 27 (SUTURE) ×1
SUT VIC AB 0 CT1 27XBRD ANBCTR (SUTURE) ×1 IMPLANT
SUT VIC AB 2-0 CT1 27 (SUTURE) ×1
SUT VIC AB 2-0 CT1 TAPERPNT 27 (SUTURE) ×1 IMPLANT
SUT VIC AB 4-0 KS 27 (SUTURE) ×2 IMPLANT
SYR BULB IRRIGATION 50ML (SYRINGE) IMPLANT
TOWEL OR 17X24 6PK STRL BLUE (TOWEL DISPOSABLE) ×2 IMPLANT
TRAY FOLEY CATH SILVER 14FR (SET/KITS/TRAYS/PACK) ×2 IMPLANT

## 2015-12-05 NOTE — Brief Op Note (Addendum)
12/04/2015 - 12/05/2015  1:04 AM  PATIENT:  Connie Newman  45 y.o. female  PRE-OPERATIVE DIAGNOSIS:  fetal intolerance to labor failed VBAC previous cesarean section bilateral tubal sterilization  POST-OPERATIVE DIAGNOSIS:  fetal intolerance to labor previous cesearan section bilateral tubal sterilization  PROCEDURE:  Procedure(s): CESAREAN SECTION (N/A)  SURGEON:  Surgeon(s) and Role:    * Tilda BurrowJohn Zanetta Dehaan V, MD - Primary  PHYSICIAN ASSISTANT:   ASSISTANTS: none   ANESTHESIA:   epidural  EBL:  Total I/O In: 500 [I.V.:500] Out: 900 [Urine:200; Blood:700]  BLOOD ADMINISTERED:none  DRAINS: Urinary Catheter (Foley)   LOCAL MEDICATIONS USED:  MARCAINE    and Amount: 20 ml  SPECIMEN:  Source of Specimen:  placenta to labor and delivery  DISPOSITION OF SPECIMEN:  Labor and delivery  COUNTS:  YES  TOURNIQUET:  * No tourniquets in log *  DICTATION: .Dragon Dictation  PLAN OF CARE: has admission orders already  PATIENT DISPOSITION:  PACU - hemodynamically stable.   Delay start of Pharmacological VTE agent (>24hrs) due to surgical blood loss or risk of bleeding: not applicable Cord pH 7.17 Indications nonreassuring fetal status with repetitive deep decelerations with contractions at 9 cm worsening when trying to push at 10 cm  Details of procedure patient was taken operating room prepped and draped promptly with the scalpel electrode activated and the normal fetal heart rate confirmed. Lectern was removed, abdomen prepped and draped Ancef administered timeout conducted and procedure initiated with a transverse lower abdominal incision size and the old cicatrix from the previous incision, with sharp dissection to the fascia which was easily/dissected off the underlying muscles midline entry of the peritoneal cavity was performed and all amount of omental adhesions freed up. Wound retractor was positioned, bladder flap developed and the very thin lower uterine segment  was opened transversely and the fetal vertex which was impacted well and the pelvis was elevated and rotated into the incision and delivered without further difficulty infant was passed to the pediatricians where some resuscitative efforts were necessary please see their details the baby did respond well and then stayed with mother.. Placenta was expelled and response to Pitocin and crit day uterine massage was intact with membranes easily removed. The uterus was irrigated,, closed and a 2 layer running locking first layer of 0 Monocryl and continuous running second layer Sterilization consisted of identifying a fallopian tube to its fimbriated end confirming that the ovaries were normal and placing a Filshie clip in the midportion of each tube . Abdomen was irrigated, anterior peritoneum closed with 2-0 Vicryl, fascia closed with running 0 Vicryl, subcutaneous tissues opened and mobilized with transverse position and the Bovie through Scarpa's fascia and then reapproximating the subcutaneous fat with 3 interrupted sutures of 2-0 Vicryl followed by subcuticular 4-0 Vicryl closure the skin. Sponge and needle counts were correct EBL 700 cc

## 2015-12-05 NOTE — Op Note (Signed)
Please see the brief operative note for surgical details 

## 2015-12-05 NOTE — Lactation Note (Signed)
This note was copied from a baby's chart. Lactation Consultation Note Initial visit made.  Spanish breastfeeding consultation services information given and reviewed.  Mom is currently breastfeeding newborn.  Baby is latched well and nursing actively.  She is still breastfeeding her 7520 month old.  Instructed to always feed newborn first.  Encouraged to call with questions prn.  Patient Name: Connie Newman ZOXWR'UToday's Date: 12/05/2015 Reason for consult: Initial assessment   Maternal Data    Feeding Feeding Type: Breast Fed  LATCH Score/Interventions Latch: Grasps breast easily, tongue down, lips flanged, rhythmical sucking.  Audible Swallowing: Spontaneous and intermittent  Type of Nipple: Everted at rest and after stimulation  Comfort (Breast/Nipple): Soft / non-tender     Hold (Positioning): No assistance needed to correctly position infant at breast.  LATCH Score: 10  Lactation Tools Discussed/Used     Consult Status Consult Status: PRN    Huston FoleyMOULDEN, Jennice Renegar S 12/05/2015, 3:31 PM

## 2015-12-05 NOTE — Care Management Note (Signed)
Case Management Note  Patient Details  Name: Barbette ReichmannBanesa Acevedo Gonzalez MRN: [id number] Date of Birth: 01/13/1971  Subjective/Objective:             Self pay           Additional Comments: CM received referral from CSW that patient may have medication needs.  CM followed up and patient is a post Csection of today 12/05/15.  Currently patient is not on any other medications other than normal post delivery medications.  CM spoke to [facility] RN of patient and she stated she is not aware of any other medicines patient may need.   Patient is self pay which was verified by Vonzell SchlatterNita McGraw the [facility].  She stated that [facility] will follow up with patient tomorrow about possibly applying for Medicaid.  CM will continue to follow but with [facility] we are unable to help with pain medicines or over the counter medicines at discharge.  If patient does have other medication needs we can assess her for [facility] closer to discharge.   Geoffery LyonsGaines, Myers Tutterow Brown, RN 12/05/2015, 1:45 PM

## 2015-12-05 NOTE — Transfer of Care (Signed)
Immediate Anesthesia Transfer of Care Note  Patient: Connie Newman  Procedure(s) Performed: Procedure(s): CESAREAN SECTION (N/A)  Patient Location: PACU  Anesthesia Type:Epidural  Level of Consciousness: awake, alert  and oriented  Airway & Oxygen Therapy: Patient Spontanous Breathing  Post-op Assessment: Report given to RN and Post -op Vital signs reviewed and stable  Post vital signs: Reviewed and stable  Last Vitals:  Filed Vitals:   12/04/15 2100 12/04/15 2130  BP: 130/73 131/68  Pulse: 90 93  Temp:    Resp:      Last Pain:  Filed Vitals:   12/04/15 2144  PainSc: 9       Patients Stated Pain Goal: 0 (12/04/15 2143)  Complications: No apparent anesthesia complications

## 2015-12-05 NOTE — Anesthesia Postprocedure Evaluation (Signed)
Anesthesia Post Note  Patient: Connie Newman  Procedure(s) Performed: Procedure(s) (LRB): CESAREAN SECTION (N/A)  Patient location during evaluation: Mother Baby Anesthesia Type: Epidural Level of consciousness: awake and alert and oriented Pain management: pain level controlled Vital Signs Assessment: post-procedure vital signs reviewed and stable Respiratory status: spontaneous breathing and nonlabored ventilation Cardiovascular status: stable Postop Assessment: epidural receding, patient able to bend at knees, no signs of nausea or vomiting and adequate PO intake Anesthetic complications: no     Last Vitals:  Filed Vitals:   12/05/15 0500 12/05/15 0600  BP: 132/56 117/53  Pulse: 88 80  Temp: 37.2 C 37.3 C  Resp: 20 20    Last Pain:  Filed Vitals:   12/05/15 0639  PainSc: 3    Pain Goal: Patients Stated Pain Goal: 0 (12/04/15 2143)               Donnalee CurryMalinova,Aryanna Shaver Hristova

## 2015-12-05 NOTE — Lactation Note (Signed)
This note was copied from a baby's chart. Lactation Consultation Note Mom very nauseated w/dry heaves. Can't do consult at this time.  Patient Name: Connie Newman Today's Date: 12/05/2015     Maternal Data Does the patient have breastfeeding experience prior to this delivery?: Yes  Feeding Feeding Type: Breast Fed Length of feed: 20 min  LATCH Score/Interventions Latch: Grasps breast easily, tongue down, lips flanged, rhythmical sucking.  Audible Swallowing: A few with stimulation Intervention(s): Skin to skin  Type of Nipple: Everted at rest and after stimulation  Comfort (Breast/Nipple): Soft / non-tender     Hold (Positioning): Assistance needed to correctly position infant at breast and maintain latch.  LATCH Score: 8  Lactation Tools Discussed/Used     Consult Status      Connie Newman 12/05/2015, 3:53 AM

## 2015-12-05 NOTE — Progress Notes (Signed)
Patient still having severe nausea and vomiting. Zerita Boersarlene Lawson CNM notified. Order to give Vistaril 50 mg IM now

## 2015-12-05 NOTE — Progress Notes (Signed)
Patient with severe nausea and vomiting . Darlene CNM notified with order to give Zofran 4mg  IV now

## 2015-12-06 ENCOUNTER — Encounter (HOSPITAL_COMMUNITY): Payer: Self-pay | Admitting: Obstetrics and Gynecology

## 2015-12-06 LAB — CBC
HCT: 27.7 % — ABNORMAL LOW (ref 36.0–46.0)
Hemoglobin: 9.1 g/dL — ABNORMAL LOW (ref 12.0–15.0)
MCH: 29.7 pg (ref 26.0–34.0)
MCHC: 32.9 g/dL (ref 30.0–36.0)
MCV: 90.5 fL (ref 78.0–100.0)
PLATELETS: 194 10*3/uL (ref 150–400)
RBC: 3.06 MIL/uL — ABNORMAL LOW (ref 3.87–5.11)
RDW: 13.8 % (ref 11.5–15.5)
WBC: 10.9 10*3/uL — AB (ref 4.0–10.5)

## 2015-12-06 NOTE — Progress Notes (Signed)
POSTPARTUM PROGRESS NOTE  Post Partum Day 1 Subjective:  Connie Newman is a 45 y.o. G2P2002 5410w1d s/p rLTCS.  No acute events overnight.  Pt denies problems with ambulating, voiding or po intake.  She denies nausea or vomiting.  Pain is well controlled.  She has had flatus. She has not had bowel movement.  Lochia Small.   Objective: Blood pressure 97/55, pulse 90, temperature 98.1 F (36.7 C), temperature source Oral, resp. rate 18, height 5' 1.42" (1.56 m), weight 171 lb (77.565 kg), SpO2 97 %, unknown if currently breastfeeding.  Physical Exam:  General: alert, cooperative and no distress Lochia:normal flow Chest: CTAB Heart: RRR no m/r/g Abdomen: +BS, soft, nontender,  Uterine Fundus: firm, below the level of the umbilicus DVT Evaluation: No calf swelling or tenderness Extremities: no edema Incision: honeycomb dressing dry   Recent Labs  12/05/15 0609 12/06/15 0619  HGB 10.7* 9.1*  HCT 31.6* 27.7*    Assessment/Plan:  ASSESSMENT: Connie Newman is a 45 y.o. G2P2002 7310w1d s/p rLTCS.  Plan for discharge tomorrow, Breastfeeding and Contraception IUD   LOS: 2 days   Hilton SinclairKaty D Janalynn Eder 12/06/2015, 7:56 AM

## 2015-12-06 NOTE — Progress Notes (Signed)
Stopped by to check on patient and also ordered her meal. Eda Pilgrim's PrideH Royal Interpreter.

## 2015-12-06 NOTE — Anesthesia Postprocedure Evaluation (Signed)
Anesthesia Post Note  Patient: Connie Newman  Procedure(s) Performed: Procedure(s) (LRB): CESAREAN SECTION (N/A)  Patient location during evaluation: PACU Anesthesia Type: Spinal Pain management: pain level controlled Vital Signs Assessment: vitals unstable Respiratory status: spontaneous breathing Cardiovascular status: stable Postop Assessment: no headache, no backache, spinal receding, patient able to bend at knees and no signs of nausea or vomiting Anesthetic complications: no     Last Vitals:  Filed Vitals:   12/05/15 1754 12/05/15 2205  BP: 105/54 97/55  Pulse: 81 90  Temp: 37.2 C 36.7 C  Resp: 19 18    Last Pain:  Filed Vitals:   12/06/15 0514  PainSc: 0-No pain   Pain Goal: Patients Stated Pain Goal: 0 (12/04/15 2143)               Brenya Taulbee JR,JOHN Susann GivensFRANKLIN

## 2015-12-06 NOTE — Lactation Note (Signed)
This note was copied from a baby's chart. Lactation Consultation Note  Patient Name: Boy Barbette ReichmannBanesa Acevedo Gonzalez JYNWG'NToday's Date: 12/06/2015 Reason for consult: Follow-up assessment   With this mom , who is tandem feeding her 33723 year old. Mom states the 8923 year old breast feed about 10 times a day. I repeated what Vernona RiegerLaura told mom yesterday, to make sure the newborn baby feeds first, and then breast fed the 75723 year old. i also told her she could feed time at the same time, but the baby needs to feed with any cue that shows he is waking up. With hand expression, mom's colostrum had a long, steady stream. Mom and dad were laughing . I reviewed with mom and dad the # of wet and dirty diapers to look for, along with having the baby's weight checked at the pediatrician, will let her know if the baby is getting enough to feed. I also showed mom how to hold the baby close, for a deep latch, which is dfiferent from the latch of a 44 year old.Mom knows to call lactation as needed - I told them we would get a Spanish interpreter to call her back, if needed.    Maternal Data    Feeding Feeding Type: Breast Fed Length of feed: 25 min  LATCH Score/Interventions Latch: Grasps breast easily, tongue down, lips flanged, rhythmical sucking.  Audible Swallowing: Spontaneous and intermittent  Type of Nipple: Everted at rest and after stimulation  Comfort (Breast/Nipple): Soft / non-tender     Hold (Positioning): No assistance needed to correctly position infant at breast.  LATCH Score: 10  Lactation Tools Discussed/Used     Consult Status Consult Status: Complete Follow-up type: Call as needed    Alfred LevinsLee, Alexyia Guarino Anne 12/06/2015, 10:51 AM

## 2015-12-06 NOTE — Progress Notes (Signed)
I check on patient's need I ordered her meals, by Orlan LeavensViria Alvarez Interpreter

## 2015-12-07 DIAGNOSIS — Z98891 History of uterine scar from previous surgery: Secondary | ICD-10-CM

## 2015-12-07 MED ORDER — IBUPROFEN 600 MG PO TABS: 600.0000 mg | ORAL_TABLET | Freq: Four times a day (QID) | ORAL | Status: DC

## 2015-12-07 MED ORDER — OXYCODONE-ACETAMINOPHEN 5-325 MG PO TABS: 1.0000 | ORAL_TABLET | ORAL | Status: DC | PRN

## 2015-12-07 NOTE — Lactation Note (Signed)
This note was copied from a baby's chart. Lactation Consultation Note Baby had 10% weight loss in 47 hrs. Of life. Out put has been 6 voids and 7 stools which could account for the loss. Baby has BF 22 times of long feedings during this time. Mom is tandom BF her 5420 months old and knows to BF newborn first.  Patient Name: Connie Newman ReichmannBanesa Acevedo Gonzalez WUJWJ'XToday's Date: 12/07/2015 Reason for consult: Follow-up assessment;Infant weight loss   Maternal Data    Feeding Feeding Type: Breast Fed Length of feed: 15 min  LATCH Score/Interventions                      Lactation Tools Discussed/Used     Consult Status Consult Status: PRN Date: 12/07/15 Follow-up type: Call as needed    Jia Mohamed G 12/07/2015, 1:35 AM

## 2015-12-07 NOTE — Discharge Summary (Signed)
OB Discharge Summary  Patient Name: Connie Newman DOB: 05/31/1971 MRN: [id number]  Date of admission: 12/04/2015 Delivering MD: Tilda BurrowFERGUSON, JOHN V   Date of discharge: 12/07/2015  Admitting diagnosis: 40WKS INDUCTION  Intrauterine pregnancy: 1061w1d     Secondary diagnosis:Principal Problem:   S/P C-section Active Problems:   AMA (advanced maternal age) multigravida - age > 1140   Post term pregnancy   Encounter for female sterilization procedure  Additional problems: None    Discharge diagnosis: Term Pregnancy Delivered                                                                     Post partum procedures:none  Augmentation: Pitocin and Foley Balloon  Complications: None  Hospital course:  Induction of Labor With Cesarean Section  45 y.o. yo G2P2002 at 4461w1d was admitted to the hospital 12/04/2015 for induction of labor. The patient went for cesarean section due to Arrest of Dilation and Non-Reassuring FHR, and delivered a Viable infant8:20 PM ,12/04/2015   Patient had an uncomplicated postpartum course. She is ambulating, tolerating a regular diet, passing flatus, and urinating well.  Patient is discharged home in stable condition on 12/07/2015.                                    Physical exam  Filed Vitals:   12/05/15 1754 12/05/15 2205 12/06/15 1834 12/07/15 0620  BP: 105/54 97/55 119/52 120/51  Pulse: 81 90 82 75  Temp: 98.9 F (37.2 C) 98.1 F (36.7 C) 98.4 F (36.9 C) 97.9 F (36.6 C)  TempSrc: Oral Oral Oral Oral  Resp: 19 18 18 18   Height:      Weight:      SpO2: 94% 97%     General: alert, cooperative and no distress Lochia: appropriate Uterine Fundus: firm Incision: Healing well with no significant drainage, Dressing is clean, dry, and intact DVT Evaluation: No evidence of DVT seen on physical exam. Labs: Lab Results  Component Value Date   WBC 10.9* 12/06/2015   HGB 9.1* 12/06/2015   HCT 27.7* 12/06/2015   MCV 90.5 12/06/2015   PLT  194 12/06/2015   CMP Latest Ref Rng 11/20/2015  Glucose 65 - 99 mg/dL 83  BUN 6 - 20 mg/dL 11  Creatinine 4.090.44 - 8.111.00 mg/dL 9.140.48  Sodium 782135 - 956145 mmol/L 136  Potassium 3.5 - 5.1 mmol/L 3.8  Chloride 101 - 111 mmol/L 106  CO2 22 - 32 mmol/L 22  Calcium 8.9 - 10.3 mg/dL 2.1(H8.6(L)  Total Protein 6.5 - 8.1 g/dL 6.8  Total Bilirubin 0.3 - 1.2 mg/dL 0.5  Alkaline Phos 38 - 126 U/L 170(H)  AST 15 - 41 U/L 21  ALT 14 - 54 U/L 15    Discharge instruction: per After Visit Summary and "Baby and Me Booklet".  After Visit Meds:    Medication List    TAKE these medications        ibuprofen 600 MG tablet  Commonly known as:  ADVIL,MOTRIN  Take 1 tablet (600 mg total) by mouth every 6 (six) hours.     oxyCODONE-acetaminophen 5-325 MG tablet  Commonly known as:  PERCOCET/ROXICET  Take 1 tablet by mouth every 4 (four) hours as needed (pain scale 4-7).     prenatal multivitamin Tabs tablet  Take 1 tablet by mouth daily at 12 noon.        Diet: routine diet  Activity: Advance as tolerated. Pelvic rest for 6 weeks.   Outpatient follow up:6 weeks Follow up Appt:No future appointments. Follow up visit: No Follow-up on file.  Postpartum contraception: IUD  Newborn Data: Live born female  Birth Weight: 8 lb (3630 g) APGAR: 3, 9  Baby Feeding: Breast Disposition:home with mother   12/07/2015 Hilton Sinclair, MD   APP attestation:  I have seen and examined this patient; I agree with above documentation in the resident's note.   Connie Newman is a 45 y.o. [id number]   PE: BP 120/51 mmHg  Pulse 75  Temp(Src) 97.9 F (36.6 C) (Oral)  Resp 18  Ht 5' 1.42" (1.56 m)  Wt 171 lb (77.565 kg)  BMI 31.87 kg/m2  SpO2 97%  Breastfeeding? Unknown Gen: calm comfortable, NAD Resp: normal effort, no distress Abd: gravid  ROS, labs, PMH reviewed  Plan: Discharge   Clemmons,Lori Grissett 12/07/2015, 11:23 AM

## 2015-12-07 NOTE — Progress Notes (Signed)
PP c/s discharge instructions given to patient and follow up instructions given along with prescription by provider. Infant instructions also given. All via video remote interpreter. No questions.

## 2015-12-07 NOTE — Progress Notes (Signed)
I ordered patient's lunch.  Connie Newman  Interpreter. °

## 2015-12-07 NOTE — Discharge Instructions (Signed)
Parto por cesrea - Cuidados posteriores  (Cesarean Delivery, Care After) Siga estas instrucciones durante las prximas semanas. Estas indicaciones le proporcionan informacin general acerca de cmo deber cuidarse despus del procedimiento. El mdico tambin podr darle instrucciones ms especficas. El tratamiento se ha planificado de acuerdo a las prcticas mdicas actuales, pero a veces se producen problemas. Comunquese con el mdico si tiene algn problema o tiene dudas cuando vuelva a su casa.  INSTRUCCIONES PARA EL CUIDADO EN EL HOGAR  Tome slo medicamentos de venta libre o recetados, segn las indicaciones del mdico.  No beba alcohol, especialmente si est amamantando o toma analgsicos.  Nomastique tabaco ni fume.  Contine con un adecuado cuidado perineal. El buen cuidado perineal incluye:  Higienizarse de adelante hacia atrs.  Mantener la zona perineal limpia.  Controlar diariamente el corte (incisin) y observar si aumenta el enrojecimiento, si supura, se hincha o se separa la piel.  Limpie la incisin suavemente con jabn y agua todos los das, y luego squela dando golpecitos. Si el mdico la autoriza, deje la incisin al descubierto. Use un apsito (vendaje) si drena lquido o la incisin parece irritada. Si las pequeas tiras [facility] que cruzan la incisin no se caen dentro de los 7 das, retrelas suavemente.  Abrace una almohada al toser o estornudar hasta que la incisin se cure. Esto ayuda a Best boy.  No conduzca vehculos ni opere maquinarias hasta que el mdico la autorice.  Dchese, lvese el cabello y tome baos de inmersin segn las indicaciones de su mdico.  Utilice un sostn que le ajuste bien y que brinde buen soporte a sus Glass blower/designer.  Limite el uso de bombachas de sostn o medias panty.  Beba suficiente lquido para Consulting civil engineer orina clara o de color amarillo plido.  Consuma todos los das alimentos ricos en fibra como cereales y panes  [place], arroz, frijoles y frutas frescas y verduras. Estos alimentos pueden ayudarla a prevenir o Cytogeneticist.  Reanude las actividades como subir escaleras, conducir automviles, levantar objetos pesados, hacer ejercicios o viajar cuando le indique su mdico.  Hable con su mdico acerca de reanudar la actividad sexual. Volver a la actividad sexual depende del riesgo de infeccin, la velocidad de la curacin y la comodidad y su deseo de Financial controller.  Trate de que alguien la ayude con las actividades del hogar y con el recin nacido al menos durante algunos das despus de salir del hospital.  Descanse todo lo que pueda. Trate de descansar o tomar una siesta mientras el beb est durmiendo.  Aumente sus actividades gradualmente.  Cumpla con todos los controles programados para despus del [place]. Es muy importante asistir a todas las visitas de Nurse, adult. En estas visitas, su mdico va a controlarla para asegurarse de que est sanando fsica y emocionalmente. SOLICITE ATENCIN MDICA SI:   Elimina cogulos grandes por la vagina. Guarde algunos cogulos para mostrarle al mdico.  Tiene una secrecin con feo olor que proviene de la vagina.  Tiene dificultad para orinar.  Orina con frecuencia.  Siente dolor al [employer].  Nota un cambio en sus movimientos intestinales.  Aumenta el enrojecimiento, el dolor o la hinchazn en la zona de la incisin.  Observa que supura pus en la incisin.  La incisin se abre.  Sus [employer] duelen, estn duras o enrojecidas.  Sufre un dolor intenso de [place].  Tiene visin borrosa o ve manchas.  Se siente triste o deprimida.  Tiene pensamientos acerca de lastimarse o daar al  recién nacido. °· Tiene preguntas acerca de su cuidado, la atención del recién nacido o acerca de los medicamentos. °· Se siente mareada o sufre un desmayo. °· Tiene una erupción. °· Siente dolor u observa enrojecimiento o hinchazón en el sitio en que  estaba la vía intravenosa (IV). °· Tiene náuseas o vómitos. °· Usted dejó de amamantar al bebé y no ha tenido su período menstrual dentro de las 12 semanas siguientes. °· No amamanta al bebé y no tuvo su período menstrual en las últimas 12 semanas. °· Tiene fiebre. °SOLICITE ATENCIÓN MÉDICA DE INMEDIATO SI:  °· Siente dolor persistente. °· Siente dolor en el pecho. °· Le falta el aire. °· Se desmaya. °· Siente dolor en la pierna. °· Siente dolor en el estómago. °· El sangrado vaginal satura dos o más apósitos en 1 hora. °ASEGÚRESE DE QUE:  °· Comprende estas instrucciones. °· Controlará su enfermedad. °· Recibirá ayuda de inmediato si no mejora o si empeora. °  °Esta información no tiene como fin reemplazar el consejo del médico. Asegúrese de hacerle al médico cualquier pregunta que tenga. °  °Document Released: 07/23/2005 Document Revised: 08/13/2014 °Elsevier Interactive Patient Education ©2016 Elsevier Inc. ° °

## 2015-12-20 ENCOUNTER — Encounter (HOSPITAL_COMMUNITY): Payer: Self-pay | Admitting: *Deleted

## 2018-01-06 ENCOUNTER — Ambulatory Visit: Payer: Self-pay | Admitting: Internal Medicine

## 2020-10-26 ENCOUNTER — Ambulatory Visit: Payer: Self-pay | Attending: Nurse Practitioner | Admitting: Nurse Practitioner

## 2020-10-26 ENCOUNTER — Encounter: Payer: Self-pay | Admitting: Nurse Practitioner

## 2020-10-26 ENCOUNTER — Other Ambulatory Visit: Payer: Self-pay

## 2020-10-26 DIAGNOSIS — Z1329 Encounter for screening for other suspected endocrine disorder: Secondary | ICD-10-CM

## 2020-10-26 DIAGNOSIS — Z131 Encounter for screening for diabetes mellitus: Secondary | ICD-10-CM

## 2020-10-26 DIAGNOSIS — Z13228 Encounter for screening for other metabolic disorders: Secondary | ICD-10-CM

## 2020-10-26 DIAGNOSIS — Z7689 Persons encountering health services in other specified circumstances: Secondary | ICD-10-CM

## 2020-10-26 DIAGNOSIS — Z1211 Encounter for screening for malignant neoplasm of colon: Secondary | ICD-10-CM

## 2020-10-26 DIAGNOSIS — Z130 Encounter for screening for diseases of the blood and blood-forming organs and certain disorders involving the immune mechanism: Secondary | ICD-10-CM

## 2020-10-26 DIAGNOSIS — Z13220 Encounter for screening for lipoid disorders: Secondary | ICD-10-CM

## 2020-10-26 NOTE — Progress Notes (Signed)
Virtual Visit via Telephone Note Due to national recommendations of social distancing due to [place] 19, telehealth visit is felt to be most appropriate for this patient at this time.  I discussed the limitations, risks, security and privacy concerns of performing an evaluation and management service by telephone and the availability of in person appointments. I also discussed with the patient that there may be a patient responsible charge related to this service. The patient expressed understanding and agreed to proceed.    I connected with Charlynn Court on 10/26/20  at   8:50 AM EDT  EDT by telephone and verified that I am speaking with the correct person using two identifiers.   Consent I discussed the limitations, risks, security and privacy concerns of performing an evaluation and management service by telephone and the availability of in person appointments. I also discussed with the patient that there may be a patient responsible charge related to this service. The patient expressed understanding and agreed to proceed.   Location of Patient: Private Residence   Location of Provider: [place] [facility] [place] Office    Persons participating in Telemedicine visit: Geryl Rankins FNP-BC [place]  [place] MG#[id number]   History of Present Illness: Telemedicine visit for: Establish Care Patient has been counseled on age-appropriate routine health concerns for screening and prevention. These are reviewed and up-to-date. Referrals have been placed accordingly. Immunizations are up-to-date or declined.    PAP SMEAR: 5 years ago. Overdue MAMMOGRAM:  10 years ago. Overdue.  Patient has been advised to apply for financial assistance and schedule to see our financial counselor.   She denies any history of DM, Thyroid disorder, HTN, HPL.      Past Medical History:  Diagnosis Date  . AMA (advanced maternal age)  multigravida 17+   . Anemia   . 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 CESAREAN SECTION N/A 12/30/2013   Procedure: CESAREAN SECTION;  Surgeon: Emily Filbert, MD;  Location: [facility];  Service: Obstetrics;  Laterality: N/A;  . CESAREAN SECTION N/A 12/05/2015   Procedure: CESAREAN SECTION;  Surgeon: Jonnie Kind, MD;  Location: [facility];  Service: Obstetrics;  Laterality: N/A;  . NO PAST SURGERIES      History reviewed. No pertinent family history.  Social History   Socioeconomic History  . Marital status: Single    Spouse name: Not on file  . Number of children: Not on file  . Years of education: Not on file  . Highest education level: Not on file  Occupational History  . Not on file  Tobacco Use  . Smoking status: Never Smoker  . Smokeless tobacco: Never Used  Vaping Use  . Vaping Use: Never used  Substance and Sexual Activity  . Alcohol use: No  . Drug use: No  . Sexual activity: Yes    Birth control/protection: None  Other Topics Concern  . Not on file  Social History Narrative  . Not on file   Social Determinants of Health   Financial Resource Strain: Not on file  Food Insecurity: Not on file  Transportation Needs: Not on file  Physical Activity: Not on file  Stress: Not on file  Social Connections: Not on file     Observations/Objective: Awake, alert and oriented x 3   Review of Systems  Constitutional: Negative  for fever, malaise/fatigue and weight loss.  HENT: Negative.  Negative for nosebleeds.   Eyes: Negative.  Negative for blurred vision, double vision and photophobia.  Respiratory: Negative.  Negative for cough and shortness of breath.   Cardiovascular: Negative.  Negative for chest pain, palpitations and leg swelling.  Gastrointestinal: Negative.  Negative for heartburn, nausea and vomiting.  Musculoskeletal: Negative.  Negative for myalgias.  Neurological:  Negative.  Negative for dizziness, focal weakness, seizures and headaches.  Psychiatric/Behavioral: Negative.  Negative for suicidal ideas.    Assessment and Plan: Shadai was seen today for new patient (initial visit).  Diagnoses and all orders for this visit:  Encounter to establish care  Encounter for screening for diabetes mellitus -     Hemoglobin A1c; Future  Screening for deficiency anemia -     CBC; Future  Screening for metabolic disorder -     [id number]; Future  Colon cancer screening -     Fecal occult blood, imunochemical; Future  Lipid screening -     Lipid panel; Future  Thyroid disorder screening -     TSH; Future     Follow Up Instructions Return for PAP SMEAR.     I discussed the assessment and treatment plan with the patient. The patient was provided an opportunity to ask questions and all were answered. The patient agreed with the plan and demonstrated an understanding of the instructions.   The patient was advised to call back or seek an in-person evaluation if the symptoms worsen or if the condition fails to improve as anticipated.  I provided 12 minutes of non-face-to-face time during this encounter including median intraservice time, reviewing previous notes, labs, imaging, medications and explaining diagnosis and management.  Gildardo Pounds, FNP-BC

## 2020-10-27 ENCOUNTER — Other Ambulatory Visit: Payer: Self-pay

## 2020-10-27 ENCOUNTER — Ambulatory Visit: Payer: Self-pay | Attending: Nurse Practitioner

## 2020-10-27 DIAGNOSIS — Z1211 Encounter for screening for malignant neoplasm of colon: Secondary | ICD-10-CM

## 2020-10-27 DIAGNOSIS — Z1329 Encounter for screening for other suspected endocrine disorder: Secondary | ICD-10-CM

## 2020-10-27 DIAGNOSIS — Z131 Encounter for screening for diabetes mellitus: Secondary | ICD-10-CM

## 2020-10-27 DIAGNOSIS — Z130 Encounter for screening for diseases of the blood and blood-forming organs and certain disorders involving the immune mechanism: Secondary | ICD-10-CM

## 2020-10-27 DIAGNOSIS — Z13220 Encounter for screening for lipoid disorders: Secondary | ICD-10-CM

## 2020-10-27 DIAGNOSIS — Z13228 Encounter for screening for other metabolic disorders: Secondary | ICD-10-CM

## 2020-10-28 LAB — CBC
Hematocrit: 40.5 % (ref 34.0–46.6)
Hemoglobin: 13.2 g/dL (ref 11.1–15.9)
MCH: 29.5 pg (ref 26.6–33.0)
MCHC: 32.6 g/dL (ref 31.5–35.7)
MCV: 91 fL (ref 79–97)
Platelets: 276 10*3/uL (ref 150–450)
RBC: 4.47 x10E6/uL (ref 3.77–5.28)
RDW: 11.8 % (ref 11.7–15.4)
WBC: 6.8 10*3/uL (ref 3.4–10.8)

## 2020-10-28 LAB — LIPID PANEL
Chol/HDL Ratio: 3.1 ratio (ref 0.0–4.4)
Cholesterol, Total: 252 mg/dL — ABNORMAL HIGH (ref 100–199)
HDL: 81 mg/dL (ref >39)
LDL Chol Calc (NIH): 154 mg/dL — ABNORMAL HIGH (ref 0–99)
Triglycerides: 97 mg/dL (ref 0–149)
VLDL Cholesterol Cal: 17 mg/dL (ref 5–40)

## 2020-10-28 LAB — CMP14+EGFR
ALT: 19 IU/L (ref 0–32)
AST: 17 IU/L (ref 0–40)
Albumin/Globulin Ratio: 1.5 (ref 1.2–2.2)
Albumin: 4.6 g/dL (ref 3.8–4.8)
Alkaline Phosphatase: 81 IU/L (ref 44–121)
BUN/Creatinine Ratio: 18 (ref 9–23)
BUN: 12 mg/dL (ref 6–24)
Bilirubin Total: 0.5 mg/dL (ref 0.0–1.2)
CO2: 19 mmol/L — ABNORMAL LOW (ref 20–29)
Calcium: 9.3 mg/dL (ref 8.7–10.2)
Chloride: 101 mmol/L (ref 96–106)
Creatinine, Ser: 0.66 mg/dL (ref 0.57–1.00)
Globulin, Total: 3.1 g/dL (ref 1.5–4.5)
Glucose: 104 mg/dL — ABNORMAL HIGH (ref 65–99)
Potassium: 4.2 mmol/L (ref 3.5–5.2)
Sodium: 137 mmol/L (ref 134–144)
Total Protein: 7.7 g/dL (ref 6.0–8.5)
eGFR: 108 mL/min/{1.73_m2} (ref >59)

## 2020-10-28 LAB — HEMOGLOBIN A1C
Est. average glucose Bld gHb Est-mCnc: 117 mg/dL
Hgb A1c MFr Bld: 5.7 % — ABNORMAL HIGH (ref 4.8–5.6)

## 2020-10-28 LAB — TSH: TSH: 1.91 u[IU]/mL (ref 0.450–4.500)

## 2020-10-30 LAB — FECAL OCCULT BLOOD, IMMUNOCHEMICAL: Fecal Occult Bld: NEGATIVE

## 2021-01-09 ENCOUNTER — Ambulatory Visit: Payer: Self-pay | Admitting: Nurse Practitioner

## 2021-04-11 ENCOUNTER — Other Ambulatory Visit: Payer: Self-pay

## 2021-04-11 ENCOUNTER — Ambulatory Visit: Payer: Self-pay | Attending: Nurse Practitioner

## 2021-04-24 ENCOUNTER — Ambulatory Visit: Payer: Self-pay | Attending: Nurse Practitioner | Admitting: Nurse Practitioner

## 2021-04-24 ENCOUNTER — Other Ambulatory Visit: Payer: Self-pay

## 2021-04-24 ENCOUNTER — Encounter: Payer: Self-pay | Admitting: Nurse Practitioner

## 2021-04-24 VITALS — BP 149/86 | HR 70 | Ht 61.0 in | Wt 172.1 lb

## 2021-04-24 DIAGNOSIS — N760 Acute vaginitis: Secondary | ICD-10-CM

## 2021-04-24 DIAGNOSIS — K089 Disorder of teeth and supporting structures, unspecified: Secondary | ICD-10-CM

## 2021-04-24 DIAGNOSIS — Z1159 Encounter for screening for other viral diseases: Secondary | ICD-10-CM

## 2021-04-24 DIAGNOSIS — R7303 Prediabetes: Secondary | ICD-10-CM

## 2021-04-24 DIAGNOSIS — Z23 Encounter for immunization: Secondary | ICD-10-CM

## 2021-04-24 MED ORDER — METRONIDAZOLE 500 MG PO TABS
500.0000 mg | ORAL_TABLET | Freq: Two times a day (BID) | ORAL | 0 refills | Status: AC
  Filled 2021-04-24: qty 14, 7d supply, fill #0

## 2021-04-24 MED ORDER — METRONIDAZOLE 500 MG PO TABS: 500.0000 mg | ORAL_TABLET | Freq: Two times a day (BID) | ORAL | 0 refills | Status: DC

## 2021-04-24 NOTE — Progress Notes (Signed)
Assessment & Plan:  Connie Newman was seen today for vaginitis.  Diagnoses and all orders for this visit:  Acute vaginitis -     metroNIDAZOLE (FLAGYL) 500 MG tablet; Take 1 tablet (500 mg total) by mouth 2 (two) times daily for 7 days.  Poor dentition -     Ambulatory referral to Dentistry  Prediabetes -     Hemoglobin A1c  Need for hepatitis C screening test -     HCV Ab w Reflex to Quant PCR   Patient has been counseled on age-appropriate routine health concerns for screening and prevention. These are reviewed and up-to-date. Referrals have been placed accordingly. Immunizations are up-to-date or declined.    Subjective:   Chief Complaint  Patient presents with   Vaginitis   HPI Connie Newman 50 y.o. female presents to office today for pap smear however it will be deferred due to her being on her menstrual cycle we will defer today. She does endorse malodorous vaginal discharge even when she is not on her cycle. Will treat as BV today.   She had coffee this morning. Blood pressure is elevated today. She does not have a history of HTN. Will recheck at next office visit. Denies chest pain, shortness of breath, palpitations, lightheadedness, dizziness, headaches or BLE edema.   BP Readings from Last 3 Encounters:  04/24/21 (!) 149/86  12/07/15 (!) 120/51  11/20/15 128/67     Review of Systems  Constitutional:  Negative for fever, malaise/fatigue and weight loss.  HENT: Negative.  Negative for nosebleeds.   Eyes: Negative.  Negative for blurred vision, double vision and photophobia.  Respiratory: Negative.  Negative for cough and shortness of breath.   Cardiovascular: Negative.  Negative for chest pain, palpitations and leg swelling.  Gastrointestinal: Negative.  Negative for heartburn, nausea and vomiting.  Genitourinary:        Vaginitis symptoms   Musculoskeletal: Negative.  Negative for myalgias.  Neurological: Negative.  Negative for dizziness, focal weakness,  seizures and headaches.  Psychiatric/Behavioral: Negative.  Negative for suicidal ideas.    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 No pertinent family history.  Social History Reviewed with no changes to be made today.   No outpatient medications prior to visit.   No facility-administered medications prior to visit.    No Known Allergies     Objective:    BP (!) 149/86   Pulse 70   Ht 5\' 1"  (1.549 m)   Wt 172 lb 2 oz (78.1 kg)   SpO2 98%   BMI 32.52 kg/m  Wt Readings from Last 3 Encounters:  04/24/21 172 lb 2 oz (78.1 kg)  12/04/15 171 lb (77.6 kg)  11/20/15 169 lb 12.8 oz (77 kg)    Physical Exam Vitals and nursing note reviewed.  Constitutional:      Appearance: She is well-developed.  HENT:  Head: Normocephalic and atraumatic.     Mouth/Throat:     Dentition: Abnormal dentition.  Cardiovascular:     Rate and Rhythm: Normal rate and regular rhythm.     Heart sounds: Normal heart sounds. No murmur heard.   No friction rub. No gallop.  Pulmonary:     Effort: Pulmonary effort is normal. No tachypnea or respiratory distress.     Breath sounds: Normal breath sounds. No decreased breath sounds, wheezing, rhonchi or rales.  Chest:     Chest wall: No tenderness.  Abdominal:     General: Bowel sounds are normal.     Palpations: Abdomen is soft.  Musculoskeletal:        General: Normal range of motion.     Cervical back: Normal  range of motion.  Skin:    General: Skin is warm and dry.  Neurological:     Mental Status: She is alert and oriented to person, place, and time.     Coordination: Coordination normal.  Psychiatric:        Behavior: Behavior normal. Behavior is cooperative.        Thought Content: Thought content normal.        Judgment: Judgment normal.         Patient has been counseled extensively about nutrition and exercise as well as the importance of adherence with medications and regular follow-up. The patient was given clear instructions to go to ER or return to medical center if symptoms don't improve, worsen or new problems develop. The patient verbalized understanding.   Follow-up: Return for PAP and repeat BP.Marland Kitchen   Claiborne Rigg, FNP-BC [facility] [place], [place] [phone]   04/24/2021, 9:59 AM

## 2021-04-24 NOTE — Addendum Note (Signed)
Addended by: Vertis Kelch on: 04/24/2021 10:13 AM   Modules accepted: Orders

## 2021-04-24 NOTE — Progress Notes (Signed)
Connie Newman [phone]

## 2021-04-25 LAB — HCV INTERPRETATION

## 2021-04-25 LAB — HCV AB W REFLEX TO QUANT PCR: HCV Ab: <0.1 s/co ratio (ref 0.0–0.9)

## 2021-04-25 LAB — HEMOGLOBIN A1C
Est. average glucose Bld gHb Est-mCnc: 134 mg/dL
Hgb A1c MFr Bld: 6.3 % — ABNORMAL HIGH (ref 4.8–5.6)

## 2021-06-09 ENCOUNTER — Encounter: Payer: Self-pay | Admitting: Nurse Practitioner

## 2021-06-09 ENCOUNTER — Other Ambulatory Visit (HOSPITAL_COMMUNITY)
Admission: RE | Admit: 2021-06-09 | Discharge: 2021-06-09 | Disposition: A | Discharge status: Home / Self Care | Payer: Self-pay | Source: Ambulatory Visit | Attending: Nurse Practitioner | Admitting: Nurse Practitioner

## 2021-06-09 ENCOUNTER — Ambulatory Visit: Payer: Self-pay | Attending: Nurse Practitioner | Admitting: Nurse Practitioner

## 2021-06-09 ENCOUNTER — Other Ambulatory Visit: Payer: Self-pay

## 2021-06-09 VITALS — BP 135/79 | HR 88 | Ht 61.0 in | Wt 162.0 lb

## 2021-06-09 DIAGNOSIS — Z1231 Encounter for screening mammogram for malignant neoplasm of breast: Secondary | ICD-10-CM

## 2021-06-09 DIAGNOSIS — Z124 Encounter for screening for malignant neoplasm of cervix: Secondary | ICD-10-CM | POA: Insufficient documentation

## 2021-06-09 NOTE — Progress Notes (Signed)
Assessment & Plan:  Connie Newman was seen today for gynecologic exam.  Diagnoses and all orders for this visit:  Encounter for Papanicolaou smear for cervical cancer screening -     Cytology - PAP -     Cervicovaginal ancillary only  Breast cancer screening by mammogram -     MM 3D SCREEN BREAST BILATERAL; Future   Patient has been counseled on age-appropriate routine health concerns for screening and prevention. These are reviewed and up-to-date. Referrals have been placed accordingly. Immunizations are up-to-date or declined.    Subjective:   Chief Complaint  Patient presents with   Gynecologic Exam   Gynecologic Exam Pertinent negatives include no abdominal pain, chills, fever, flank pain or rash.  Connie Newman 50 y.o. female presents to office today   BP Readings from Last 3 Encounters:  06/09/21 135/79  04/24/21 (!) 149/86  12/07/15 (!) 120/51     Review of Systems  Constitutional: Negative.  Negative for chills, fever, malaise/fatigue and weight loss.  Respiratory: Negative.  Negative for cough, shortness of breath and wheezing.   Cardiovascular: Negative.  Negative for chest pain, orthopnea and leg swelling.  Gastrointestinal:  Negative for abdominal pain.  Genitourinary: Negative.  Negative for flank pain.  Skin: Negative.  Negative for rash.  Psychiatric/Behavioral:  Negative for suicidal ideas.    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 No pertinent family history.  Social History Reviewed with no changes to be made today.   No outpatient medications prior to visit.   No facility-administered medications prior to visit.    No Known Allergies     Objective:    BP 135/79   Pulse 88   Ht 5\' 1"  (1.549 m)   Wt 162 lb (73.5 kg)   LMP 05/24/2021 (Approximate)   SpO2 99%   BMI 30.61 kg/m  Wt Readings from Last 3 Encounters:  06/09/21 162 lb (73.5 kg)  04/24/21 172 lb 2 oz (78.1 kg)  12/04/15 171 lb (77.6 kg)    Physical Exam Exam conducted with a chaperone present.  Constitutional:      Appearance: She is well-developed.  HENT:     Head: Normocephalic.  Cardiovascular:     Rate and Rhythm: Normal rate and regular rhythm.     Heart sounds: Normal heart sounds.  Pulmonary:     Effort: Pulmonary effort is normal.     Breath sounds: Normal breath sounds.  Abdominal:     General: Bowel sounds are normal.     Palpations: Abdomen is soft.     Hernia: There is no hernia in the left inguinal area.  Genitourinary:    Exam position: Lithotomy position.     Labia:        Right: No rash, tenderness, lesion or injury.        Left: No rash, tenderness, lesion or injury.  Vagina: Normal. No signs of injury and foreign body. No vaginal discharge, erythema, tenderness or bleeding.     Cervix: No cervical motion tenderness or friability.     Uterus: Not deviated and not enlarged.      Adnexa:        Right: No mass, tenderness or fullness.         Left: No mass, tenderness or fullness.       Rectum: Normal. No external hemorrhoid.  Lymphadenopathy:     Lower Body: No right inguinal adenopathy. No left inguinal adenopathy.  Skin:    General: Skin is warm and dry.  Neurological:     Mental Status: She is alert and oriented to person, place, and time.  Psychiatric:        Behavior: Behavior normal.        Thought Content: Thought content normal.        Judgment: Judgment normal.          Patient has been counseled extensively about nutrition and exercise as well as the importance of adherence with medications and regular follow-up. The patient was given clear instructions to go to ER or return to medical center if symptoms don't improve, worsen or new problems develop. The patient verbalized understanding.   Follow-up: Return in about 4 months (around 10/07/2021) for A1c and BP.   Claiborne Rigg, FNP-BC [facility] and [facility] [place], [place] [phone]   06/09/2021, 11:43 AM

## 2021-06-12 LAB — CERVICOVAGINAL ANCILLARY ONLY
Bacterial Vaginitis (gardnerella): NEGATIVE
Candida Glabrata: NEGATIVE
Candida Vaginitis: POSITIVE — AB
Chlamydia: NEGATIVE
Comment: NEGATIVE
Comment: NEGATIVE
Comment: NEGATIVE
Comment: NEGATIVE
Comment: NEGATIVE
Comment: NORMAL
Neisseria Gonorrhea: NEGATIVE
Trichomonas: NEGATIVE

## 2021-06-13 LAB — CYTOLOGY - PAP
Adequacy: ABSENT
Comment: NEGATIVE
Diagnosis: NEGATIVE
High risk HPV: NEGATIVE

## 2021-06-14 ENCOUNTER — Other Ambulatory Visit: Payer: Self-pay | Admitting: Nurse Practitioner

## 2021-06-14 MED ORDER — FLUCONAZOLE 150 MG PO TABS: 150.0000 mg | ORAL_TABLET | Freq: Once | ORAL | 0 refills | Status: AC

## 2021-06-15 ENCOUNTER — Other Ambulatory Visit: Payer: Self-pay | Admitting: Obstetrics and Gynecology

## 2021-06-15 ENCOUNTER — Telehealth: Payer: Self-pay

## 2021-06-15 DIAGNOSIS — Z1231 Encounter for screening mammogram for malignant neoplasm of breast: Secondary | ICD-10-CM

## 2021-06-15 NOTE — Telephone Encounter (Signed)
-----   Message from Claiborne Rigg, NP sent at 06/14/2021  8:24 AM EST ----- Normal Pap smear.  Repeat in 3 years.  Vaginal swab positive for yeast.  We will send Diflucan pill to the pharmacy.

## 2021-06-15 NOTE — Telephone Encounter (Signed)
Left message to return call to our office.  

## 2021-06-22 ENCOUNTER — Telehealth: Payer: Self-pay | Admitting: Nurse Practitioner

## 2021-06-22 NOTE — Telephone Encounter (Signed)
This patient came by because she said the dentist she was referred doesn't accept orange card and needs a new referral sent to another dentist. Advised that dentist will call her to schedule appt

## 2021-07-25 ENCOUNTER — Ambulatory Visit
Admission: RE | Admit: 2021-07-25 | Discharge: 2021-07-25 | Disposition: A | Discharge status: Home / Self Care | Payer: No Typology Code available for payment source | Source: Ambulatory Visit | Attending: Obstetrics and Gynecology | Admitting: Obstetrics and Gynecology

## 2021-07-25 ENCOUNTER — Ambulatory Visit: Payer: Self-pay | Admitting: *Deleted

## 2021-07-25 ENCOUNTER — Other Ambulatory Visit: Payer: Self-pay

## 2021-07-25 VITALS — BP 120/76 | Wt 162.5 lb

## 2021-07-25 DIAGNOSIS — Z1239 Encounter for other screening for malignant neoplasm of breast: Secondary | ICD-10-CM

## 2021-07-25 DIAGNOSIS — Z1231 Encounter for screening mammogram for malignant neoplasm of breast: Secondary | ICD-10-CM

## 2021-07-25 NOTE — Patient Instructions (Addendum)
Explained breast self awareness with Barbette Reichmann. Patient did not need a Pap smear today due to last Pap smear and HPV typing was 06/09/2021. Let her know [facility] will cover Pap smears and HPV typing every 5 years unless has a history of abnormal Pap smears. Referred patient to the [facility] for a screening mammogram on mobile unit. Appointment scheduled Tuesday, July 25, 2021 at 1030. Patient escorted to the mobile unit following [facility] appointment for her screening mammogram. Let patient know the Breast Center will follow up with her within the next couple weeks with results of her mammogram by letter or phone. Barbette Reichmann verbalized understanding.  Ryian Lynde, Kathaleen Maser, RN 9:59 AM

## 2021-07-25 NOTE — Progress Notes (Signed)
Ms. Gia Lusher is a 50 y.o. female who presents to [facility] clinic today with no complaints.    Pap Smear: Pap smear not completed today. Last Pap smear was 06/09/2021 at [facility] and was normal with negative HPV. Per patient has no history of an abnormal Pap smear. Last Pap smear result is available in Epic.   Physical exam: Breasts Breasts symmetrical. No skin abnormalities bilateral breasts. No nipple retraction bilateral breasts. No nipple discharge bilateral breasts. No lymphadenopathy. No lumps palpated bilateral breasts. No complaints of pain or tenderness on exam.  Pelvic/Bimanual Pap is not indicated today per [facility] guidelines.   Smoking History: Patient has never smoked.   Patient Navigation: Patient education provided. Access to services provided for patient through [place] program. Spanish interpreter Natale Lay from [facility] provided.   Colorectal Cancer Screening: Per patient has never had colonoscopy completed. Patient completed a FIT test 10/28/2020 that was negative. No complaints today.    Breast and Cervical Cancer Risk Assessment: Patient does not have family history of breast cancer, known genetic mutations, or radiation treatment to the chest before age 32. Patient does not have history of cervical dysplasia, immunocompromised, or DES exposure in-utero.  Risk Assessment     Risk Scores       07/25/2021   Last edited by: Narda Rutherford, LPN   5-year risk: 1 %   Lifetime risk: 9.6 %           A: [facility] exam without pap smear No complaints.  P: Referred patient to the [facility] for a screening mammogram on mobile unit. Appointment scheduled Tuesday, July 25, 2021 at 1030.  Priscille Heidelberg, RN 07/25/2021 9:59 AM

## 2021-10-13 ENCOUNTER — Ambulatory Visit: Payer: Self-pay | Admitting: Nurse Practitioner

## 2021-12-01 ENCOUNTER — Ambulatory Visit: Payer: No Typology Code available for payment source | Admitting: Nurse Practitioner

## 2022-02-21 ENCOUNTER — Ambulatory Visit: Payer: Self-pay | Attending: Nurse Practitioner | Admitting: Nurse Practitioner

## 2022-02-21 ENCOUNTER — Encounter: Payer: Self-pay | Admitting: Nurse Practitioner

## 2022-02-21 ENCOUNTER — Other Ambulatory Visit: Payer: Self-pay

## 2022-02-21 VITALS — BP 126/76 | HR 87 | Wt 165.4 lb

## 2022-02-21 DIAGNOSIS — Z862 Personal history of diseases of the blood and blood-forming organs and certain disorders involving the immune mechanism: Secondary | ICD-10-CM

## 2022-02-21 DIAGNOSIS — E785 Hyperlipidemia, unspecified: Secondary | ICD-10-CM

## 2022-02-21 DIAGNOSIS — R7303 Prediabetes: Secondary | ICD-10-CM

## 2022-02-21 DIAGNOSIS — G629 Polyneuropathy, unspecified: Secondary | ICD-10-CM

## 2022-02-21 DIAGNOSIS — Z1211 Encounter for screening for malignant neoplasm of colon: Secondary | ICD-10-CM

## 2022-02-21 DIAGNOSIS — N939 Abnormal uterine and vaginal bleeding, unspecified: Secondary | ICD-10-CM

## 2022-02-21 MED ORDER — GABAPENTIN 100 MG PO CAPS
100.0000 mg | ORAL_CAPSULE | Freq: Three times a day (TID) | ORAL | 3 refills | Status: DC
  Filled 2022-02-21: qty 90, 30d supply, fill #0
  Filled 2022-03-17: qty 90, 30d supply, fill #1

## 2022-02-21 NOTE — Progress Notes (Signed)
Assessment & Plan:  Connie Newman was seen today for diabetes, menstrual problem and tingling.  Diagnoses and all orders for this visit:  Abnormal uterine bleeding (AUB) -     US Pelvic Complete With Transvaginal; Future -     Thyroid Panel With TSH  Prediabetes -     Thyroid Panel With TSH -     CMP14+EGFR -     Hemoglobin A1c  Neuropathy -     gabapentin (NEURONTIN) 100 MG capsule; Take 1 capsule (100 mg total) by mouth 3 (three) times daily.  History of anemia -     CBC with Differential  Colon cancer screening -     Fecal occult blood, imunochemical([facility])  Dyslipidemia, goal LDL below 70 -     Lipid panel    Patient has been counseled on age-appropriate routine health concerns for screening and prevention. These are reviewed and up-to-date. Referrals have been placed accordingly. Immunizations are up-to-date or declined.    Subjective:   Chief Complaint  Patient presents with   Diabetes   Menstrual Problem   Tingling    Connie Newman 51 y.o. female presents to office today with concerns of irregular bleeding, carpal tunnel symptoms and f/u to prediabetes.    States menstrual cycles have increased in duration and flow heaviness (previously 5 days and now 7 days) Most recent menstrual cycle ended 2 days ago. Symptoms started a few months ago. Could likely be related to perimenopause as she also reports hot flashes but will order pelvic US to r/o any structural abnormalities.    BP Readings from Last 3 Encounters:  02/21/22 126/76  07/25/21 120/76  06/09/21 135/79    Carpal Tunnel Symptoms Started 3 months ago.  Notes numbness and tingling when she is gripping objects as well as the steering wheel, Closing fists, and or sweeping.    Prediabetes Currently controlled with diet only at this time.  Lab Results  Component Value Date   HGBA1C 6.3 (H) 04/24/2021    Review of Systems  Constitutional:  Negative for fever, malaise/fatigue and  weight loss.  HENT: Negative.  Negative for nosebleeds.   Eyes: Negative.  Negative for blurred vision, double vision and photophobia.  Respiratory: Negative.  Negative for cough and shortness of breath.   Cardiovascular: Negative.  Negative for chest pain, palpitations and leg swelling.  Gastrointestinal: Negative.  Negative for heartburn, nausea and vomiting.  Musculoskeletal:  Positive for joint pain. Negative for myalgias.  Neurological:  Positive for tingling. Negative for dizziness, focal weakness, seizures and headaches.  Psychiatric/Behavioral: Negative.  Negative for suicidal ideas.     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    has sensation, but unable to move that side    Past Surgical History:  Procedure Laterality Date   CESAREAN SECTION N/A 12/30/2013   Procedure: CESAREAN SECTION;  Surgeon: Emily Filbert, MD;  Location: [facility];  Service: Obstetrics;  Laterality: N/A;   CESAREAN SECTION N/A 12/05/2015   Procedure: CESAREAN SECTION;  Surgeon: Jonnie Kind, MD;  Location: [facility];  Service: Obstetrics;  Laterality: N/A;   NO PAST SURGERIES      Family History  Problem Relation Age of Onset   Hypertension Mother    Diabetes Father    Diabetes Paternal Grandmother    Diabetes Paternal Grandfather     Social History Reviewed with no changes to  be made today.   No outpatient medications prior to visit.   No facility-administered medications prior to visit.    No Known Allergies     Objective:    BP 126/76 (BP Location: Left Arm, Cuff Size: Normal)   Pulse 87   Wt 165 lb 6.4 oz (75 kg)   SpO2 95%   BMI 31.25 kg/m  Wt Readings from Last 3 Encounters:  02/21/22 165 lb 6.4 oz (75 kg)  07/25/21 162 lb 8 oz (73.7 kg)  06/09/21 162 lb (73.5 kg)    Physical Exam Vitals and nursing note reviewed.  Constitutional:      Appearance: She is well-developed.  HENT:     Head:  Normocephalic and atraumatic.  Cardiovascular:     Rate and Rhythm: Normal rate and regular rhythm.     Heart sounds: Normal heart sounds. No murmur heard.    No friction rub. No gallop.  Pulmonary:     Effort: Pulmonary effort is normal. No tachypnea or respiratory distress.     Breath sounds: Normal breath sounds. No decreased breath sounds, wheezing, rhonchi or rales.  Chest:     Chest wall: No tenderness.  Abdominal:     General: Bowel sounds are normal.     Palpations: Abdomen is soft.  Musculoskeletal:        General: Normal range of motion.     Cervical back: Normal range of motion.  Skin:    General: Skin is warm and dry.  Neurological:     Mental Status: She is alert and oriented to person, place, and time.     Coordination: Coordination normal.  Psychiatric:        Behavior: Behavior normal. Behavior is cooperative.        Thought Content: Thought content normal.        Judgment: Judgment normal.          Patient has been counseled extensively about nutrition and exercise as well as the importance of adherence with medications and regular follow-up. The patient was given clear instructions to go to ER or return to medical center if symptoms don't improve, worsen or new problems develop. The patient verbalized understanding.   Follow-up: Return in about 4 weeks (around 03/21/2022) for virtual in 4 weeks on a tuesday for neuropathy.   Gildardo Pounds, FNP-BC [facility] [place], [place]   02/21/2022, 4:28 PM

## 2022-02-22 LAB — CMP14+EGFR
ALT: 15 IU/L (ref 0–32)
AST: 16 IU/L (ref 0–40)
Albumin/Globulin Ratio: 1.4 (ref 1.2–2.2)
Albumin: 4.3 g/dL (ref 3.9–4.9)
Alkaline Phosphatase: 86 IU/L (ref 44–121)
BUN/Creatinine Ratio: 16 (ref 9–23)
BUN: 15 mg/dL (ref 6–24)
Bilirubin Total: 0.4 mg/dL (ref 0.0–1.2)
CO2: 26 mmol/L (ref 20–29)
Calcium: 9.2 mg/dL (ref 8.7–10.2)
Chloride: 100 mmol/L (ref 96–106)
Creatinine, Ser: 0.96 mg/dL (ref 0.57–1.00)
Globulin, Total: 3.1 g/dL (ref 1.5–4.5)
Glucose: 128 mg/dL — ABNORMAL HIGH (ref 70–99)
Potassium: 4 mmol/L (ref 3.5–5.2)
Sodium: 139 mmol/L (ref 134–144)
Total Protein: 7.4 g/dL (ref 6.0–8.5)
eGFR: 73 mL/min/{1.73_m2} (ref >59)

## 2022-02-22 LAB — LIPID PANEL
Chol/HDL Ratio: 3.7 ratio (ref 0.0–4.4)
Cholesterol, Total: 266 mg/dL — ABNORMAL HIGH (ref 100–199)
HDL: 71 mg/dL (ref >39)
LDL Chol Calc (NIH): 171 mg/dL — ABNORMAL HIGH (ref 0–99)
Triglycerides: 133 mg/dL (ref 0–149)
VLDL Cholesterol Cal: 24 mg/dL (ref 5–40)

## 2022-02-22 LAB — CBC WITH DIFFERENTIAL/PLATELET
Basophils Absolute: 0.1 10*3/uL (ref 0.0–0.2)
Basos: 1 %
EOS (ABSOLUTE): 0.2 10*3/uL (ref 0.0–0.4)
Eos: 3 %
Hematocrit: 37 % (ref 34.0–46.6)
Hemoglobin: 12.6 g/dL (ref 11.1–15.9)
Immature Grans (Abs): 0 10*3/uL (ref 0.0–0.1)
Immature Granulocytes: 0 %
Lymphocytes Absolute: 2 10*3/uL (ref 0.7–3.1)
Lymphs: 26 %
MCH: 29.6 pg (ref 26.6–33.0)
MCHC: 34.1 g/dL (ref 31.5–35.7)
MCV: 87 fL (ref 79–97)
Monocytes Absolute: 0.5 10*3/uL (ref 0.1–0.9)
Monocytes: 6 %
Neutrophils Absolute: 5 10*3/uL (ref 1.4–7.0)
Neutrophils: 64 %
Platelets: 312 10*3/uL (ref 150–450)
RBC: 4.25 x10E6/uL (ref 3.77–5.28)
RDW: 12 % (ref 11.7–15.4)
WBC: 7.8 10*3/uL (ref 3.4–10.8)

## 2022-02-22 LAB — THYROID PANEL WITH TSH
Free Thyroxine Index: 1.5 (ref 1.2–4.9)
T3 Uptake Ratio: 22 % — ABNORMAL LOW (ref 24–39)
T4, Total: 6.8 ug/dL (ref 4.5–12.0)
TSH: 1.21 u[IU]/mL (ref 0.450–4.500)

## 2022-02-22 LAB — HEMOGLOBIN A1C
Est. average glucose Bld gHb Est-mCnc: 120 mg/dL
Hgb A1c MFr Bld: 5.8 % — ABNORMAL HIGH (ref 4.8–5.6)

## 2022-03-19 ENCOUNTER — Other Ambulatory Visit: Payer: Self-pay

## 2022-03-20 ENCOUNTER — Encounter: Payer: Self-pay | Admitting: Nurse Practitioner

## 2022-03-20 ENCOUNTER — Other Ambulatory Visit: Payer: Self-pay | Admitting: Nurse Practitioner

## 2022-03-20 ENCOUNTER — Telehealth (HOSPITAL_BASED_OUTPATIENT_CLINIC_OR_DEPARTMENT_OTHER): Payer: Self-pay | Admitting: Nurse Practitioner

## 2022-03-20 ENCOUNTER — Other Ambulatory Visit: Payer: Self-pay

## 2022-03-20 DIAGNOSIS — G5601 Carpal tunnel syndrome, right upper limb: Secondary | ICD-10-CM

## 2022-03-20 DIAGNOSIS — E7800 Pure hypercholesterolemia, unspecified: Secondary | ICD-10-CM

## 2022-03-20 DIAGNOSIS — G629 Polyneuropathy, unspecified: Secondary | ICD-10-CM

## 2022-03-20 MED ORDER — GABAPENTIN 300 MG PO CAPS
300.0000 mg | ORAL_CAPSULE | Freq: Three times a day (TID) | ORAL | 2 refills | Status: DC
  Filled 2022-03-20: qty 90, 30d supply, fill #0

## 2022-03-20 NOTE — Progress Notes (Signed)
Virtual Visit via Telephone Note  I discussed the limitations, risks, security and privacy concerns of performing an evaluation and management service by telephone and the availability of in person appointments. I also discussed with the patient that there may be a patient responsible charge related to this service. The patient expressed understanding and agreed to proceed.    I connected with Connie Newman on 03/21/22  at   3:30 PM EDT  EDT by telephone and verified that I am speaking with the correct person using two identifiers.  Location of Patient: Private Residence   Location of Provider: [facility]    Persons participating in Telemedicine visit: Bertram Denver FNP-BC Aveline Raena Pau INTERPRETER [phone]   History of Present Illness: Telemedicine visit for: Carpal tunnel syndrome  Carpal Tunnel Syndrome: Patient presents for presents evaluation of pain in hands, hand paresthesias, and possible carpal tunnel syndrome.  Onset of the symptoms was several months ago. Current symptoms include numbness and tingling when she is gripping objects as well as the steering wheel, closing her fist or sweeping. Inciting event/aggravating factors:  repetitive activity.  Patient's course of [id number] have progressed to a point and plateaued. Evaluation to date: none.  Treatment to date:  gabapentin and NSAIDs/ineffective .     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    has sensation, but unable to move that side    Past Surgical History:  Procedure Laterality Date   CESAREAN SECTION N/A 12/30/2013   Procedure: CESAREAN SECTION;  Surgeon: Allie Bossier, MD;  Location: [facility];  Service: Obstetrics;  Laterality: N/A;   CESAREAN SECTION N/A 12/05/2015   Procedure: CESAREAN SECTION;  Surgeon: Tilda Burrow, MD;  Location: [facility];  Service: Obstetrics;   Laterality: N/A;   NO PAST SURGERIES      Family History  Problem Relation Age of Onset   Hypertension Mother    Diabetes Father    Diabetes Paternal Grandmother    Diabetes Paternal Grandfather     Social History   Socioeconomic History   Marital status: Married    Spouse name: Not on file   Number of children: 2   Years of education: Not on file   Highest education level: High school graduate  Occupational History   Not on file  Tobacco Use   Smoking status: Never   Smokeless tobacco: Never  Vaping Use   Vaping Use: Never used  Substance and Sexual Activity   Alcohol use: No   Drug use: No   Sexual activity: Yes    Birth control/protection: None  Other Topics Concern   Not on file  Social History Narrative   Not on file   Social Determinants of Health   Financial Resource Strain: Not on file  Food Insecurity: No Food Insecurity (07/25/2021)   Hunger Vital Sign    Worried About Running Out of Food in the Last Year: Never true    Ran Out of Food in the Last Year: Never true  Transportation Needs: No Transportation Needs (07/25/2021)   PRAPARE - Administrator, Civil Service (Medical): No    Lack of Transportation (Non-Medical): No  Physical Activity: Not on file  Stress: Not on file  Social Connections: Not on file     Observations/Objective: Awake, alert and oriented x 3   Review of Systems  Constitutional:  Negative for fever, malaise/fatigue and weight loss.  HENT: Negative.  Negative for nosebleeds.   Eyes: Negative.  Negative for blurred vision, double vision and photophobia.  Respiratory: Negative.  Negative for cough and shortness of breath.   Cardiovascular: Negative.  Negative for chest pain, palpitations and leg swelling.  Gastrointestinal: Negative.  Negative for heartburn, nausea and vomiting.  Musculoskeletal: Negative.  Negative for myalgias.  Neurological:  Positive for tingling and sensory change. Negative for dizziness, focal  weakness, seizures and headaches.  Psychiatric/Behavioral: Negative.  Negative for suicidal ideas.     Diagnoses and all orders for this visit:  Carpal tunnel syndrome on right -     gabapentin (NEURONTIN) 300 MG capsule; Take 1 capsule (300 mg total) by mouth 3 (three) times daily. Referred to hand specialist  Neuropathy -     gabapentin (NEURONTIN) 300 MG capsule; Take 1 capsule (300 mg total) by mouth 3 (three) times daily.      Follow Up Instructions Return if symptoms worsen or fail to improve.     I discussed the assessment and treatment plan with the patient. The patient was provided an opportunity to ask questions and all were answered. The patient agreed with the plan and demonstrated an understanding of the instructions.   The patient was advised to call back or seek an in-person evaluation if the symptoms worsen or if the condition fails to improve as anticipated.  I provided 11 minutes of non-face-to-face time during this encounter including median intraservice time, reviewing previous notes, labs, imaging, medications and explaining diagnosis and management.  Claiborne Rigg, FNP-BC

## 2022-03-21 ENCOUNTER — Other Ambulatory Visit: Payer: Self-pay

## 2022-03-29 ENCOUNTER — Other Ambulatory Visit: Payer: Self-pay

## 2022-04-03 ENCOUNTER — Ambulatory Visit: Payer: Self-pay | Admitting: Orthopaedic Surgery

## 2022-04-04 ENCOUNTER — Ambulatory Visit: Payer: Self-pay | Attending: Internal Medicine

## 2022-04-04 DIAGNOSIS — E7800 Pure hypercholesterolemia, unspecified: Secondary | ICD-10-CM

## 2022-04-05 LAB — LIPID PANEL
Chol/HDL Ratio: 3.2 ratio (ref 0.0–4.4)
Cholesterol, Total: 246 mg/dL — ABNORMAL HIGH (ref 100–199)
HDL: 76 mg/dL (ref >39)
LDL Chol Calc (NIH): 155 mg/dL — ABNORMAL HIGH (ref 0–99)
Triglycerides: 87 mg/dL (ref 0–149)
VLDL Cholesterol Cal: 15 mg/dL (ref 5–40)

## 2022-04-12 ENCOUNTER — Ambulatory Visit (INDEPENDENT_AMBULATORY_CARE_PROVIDER_SITE_OTHER): Payer: Self-pay | Admitting: Orthopaedic Surgery

## 2022-04-12 DIAGNOSIS — G5601 Carpal tunnel syndrome, right upper limb: Secondary | ICD-10-CM

## 2022-04-12 NOTE — Progress Notes (Signed)
Office Visit Note   Patient: Connie Newman           Date of Birth: [birth year]           MRN: [id number] Visit Date: 04/12/2022              Requested by: Claiborne Rigg, NP [address] [place],  [place] [address] PCP: Claiborne Rigg, NP   Assessment & Plan: Visit Diagnoses:  1. Right carpal tunnel syndrome     Plan: Impression is right carpal tunnel syndrome.  Symptoms seems to be mild.  We will start with nighttime bracing.  If symptoms persist patient will follow-up for further evaluation and likely nerve conduction studies.  Language barrier increased complexity of the visit.  Interpreter present today.  Follow-Up Instructions: No follow-ups on file.   Orders:  No orders of the defined types were placed in this encounter.  No orders of the defined types were placed in this encounter.     Procedures: No procedures performed   Clinical Data: No additional findings.   Subjective: Chief Complaint  Patient presents with   Right Hand - Pain    HPI Patient is a very pleasant 51 year old female here with interpreter for evaluation of tingling in her hand for 4 months.  Works at [employer].  PCP prescribed gabapentin which did not help.  Had pregnancy-induced carpal tunnel syndrome in the past.  Review of Systems  Constitutional: Negative.   HENT: Negative.    Eyes: Negative.   Respiratory: Negative.    Cardiovascular: Negative.   Endocrine: Negative.   Musculoskeletal: Negative.   Neurological: Negative.   Hematological: Negative.   Psychiatric/Behavioral: Negative.    All other systems reviewed and are negative.    Objective: Vital Signs: There were no vitals taken for this visit.  Physical Exam Vitals and nursing note reviewed.  Constitutional:      Appearance: She is well-developed.  HENT:     Head: Atraumatic.     Nose: Nose normal.  Eyes:     Extraocular Movements: Extraocular movements intact.  Cardiovascular:      Pulses: Normal pulses.  Pulmonary:     Effort: Pulmonary effort is normal.  Abdominal:     Palpations: Abdomen is soft.  Musculoskeletal:     Cervical back: Neck supple.  Skin:    General: Skin is warm.     Capillary Refill: Capillary refill takes less than 2 seconds.  Neurological:     Mental Status: She is alert. Mental status is at baseline.  Psychiatric:        Behavior: Behavior normal.        Thought Content: Thought content normal.        Judgment: Judgment normal.     Ortho Exam Emanation of right hand shows no muscle atrophy.  Normal range of motion of the thumb.  Normal strength.  Positive Durkan sign.  Negative Tinel and Phalen's.  Sensation intact to all fingertips.  Normal capillary refill.  Normal temperature. Specialty Comments:  No specialty comments available.  Imaging: No results found.   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    has sensation, but unable to move that side    Family History  Problem Relation Age of Onset   Hypertension Mother    Diabetes Father    Diabetes Paternal Grandmother    Diabetes Paternal Grandfather     Past Surgical History:  Procedure Laterality Date   CESAREAN SECTION N/A 12/30/2013   Procedure: CESAREAN SECTION;  Surgeon: Allie Bossier, MD;  Location: [facility];  Service: Obstetrics;  Laterality: N/A;   CESAREAN SECTION N/A 12/05/2015   Procedure: CESAREAN SECTION;  Surgeon: Tilda Burrow, MD;  Location: [facility];  Service: Obstetrics;  Laterality: N/A;   NO PAST  SURGERIES     Social History   Occupational History   Not on file  Tobacco Use   Smoking status: Never   Smokeless tobacco: Never  Vaping Use   Vaping Use: Never used  Substance and Sexual Activity   Alcohol use: No   Drug use: No   Sexual activity: Yes    Birth control/protection: None

## 2022-06-06 ENCOUNTER — Ambulatory Visit: Payer: Self-pay | Attending: Nurse Practitioner | Admitting: Nurse Practitioner

## 2022-06-06 ENCOUNTER — Encounter: Payer: Self-pay | Admitting: Nurse Practitioner

## 2022-06-06 VITALS — BP 150/78 | Temp 98.0°F | Ht 61.0 in | Wt 173.0 lb

## 2022-06-06 DIAGNOSIS — Z23 Encounter for immunization: Secondary | ICD-10-CM

## 2022-06-06 DIAGNOSIS — K089 Disorder of teeth and supporting structures, unspecified: Secondary | ICD-10-CM

## 2022-06-06 DIAGNOSIS — Z1231 Encounter for screening mammogram for malignant neoplasm of breast: Secondary | ICD-10-CM

## 2022-06-06 NOTE — Progress Notes (Signed)
Assessment & Plan:  Connie Newman was seen today for referral.  Diagnoses and all orders for this visit:  Poor dentition -     Ambulatory referral to Dentistry  Breast cancer screening by mammogram -     MS DIGITAL SCREENING TOMO BILATERAL; Future    Patient has been counseled on age-appropriate routine health concerns for screening and prevention. These are reviewed and up-to-date. Referrals have been placed accordingly. Immunizations are up-to-date or declined.    Subjective:   Chief Complaint  Patient presents with   Referral    Referral to dentist.  Pt states that cough started today, and there are no other symptoms.    HPI Connie Newman 51 y.o. female presents to office today requesting a dental referral for 2 broken molars.    VRI was used to communicate directly with patient for the entire encounter including providing detailed patient instructions.    Blood pressure is elevated today. She feels like she is coming down with a cold. Denies sore throat, fever, headache, or nasal congestion.  BP Readings from Last 3 Encounters:  06/06/22 (!) 150/78  02/21/22 126/76  07/25/21 120/76   She is being followed by ortho for right carpal tunnel. States after wearing brace for several weeks she has not noticed any improvement in symptoms.   Review of Systems  Constitutional:  Negative for fever, malaise/fatigue and weight loss.  HENT: Negative.  Negative for nosebleeds.   Eyes: Negative.  Negative for blurred vision, double vision and photophobia.  Respiratory:  Positive for cough and sputum production. Negative for hemoptysis, shortness of breath and wheezing.   Cardiovascular: Negative.  Negative for chest pain, palpitations and leg swelling.  Gastrointestinal: Negative.  Negative for heartburn, nausea and vomiting.  Musculoskeletal: Negative.  Negative for myalgias.  Neurological: Negative.  Negative for dizziness, focal weakness, seizures and headaches.   Psychiatric/Behavioral: Negative.  Negative for suicidal ideas.     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    has sensation, but unable to move that side    Past Surgical History:  Procedure Laterality Date   CESAREAN SECTION N/A 12/30/2013   Procedure: CESAREAN SECTION;  Surgeon: Emily Filbert, MD;  Location: [facility];  Service: Obstetrics;  Laterality: N/A;   CESAREAN SECTION N/A 12/05/2015   Procedure: CESAREAN SECTION;  Surgeon: Jonnie Kind, MD;  Location: [facility];  Service: Obstetrics;  Laterality: N/A;   NO PAST SURGERIES      Family History  Problem Relation Age of Onset   Hypertension Mother    Diabetes Father    Diabetes Paternal Grandmother    Diabetes Paternal Grandfather     Social History Reviewed with no changes to be made today.   Outpatient Medications Prior to Visit  Medication Sig Dispense Refill   gabapentin (NEURONTIN) 300 MG capsule Take 1 capsule (300 mg total) by mouth 3 (three) times daily. (Patient not taking: Reported on 06/06/2022) 90 capsule 2   No facility-administered medications prior to visit.    No Known Allergies     Objective:    BP (!) 150/78   Temp 98 F (36.7 C) (Temporal)   Ht 5\' 1"  (1.549 m)   Wt 173 lb (78.5 kg)   SpO2 98%   BMI 32.69 kg/m  Wt Readings from Last 3 Encounters:  06/06/22 173 lb (78.5 kg)  02/21/22 165 lb 6.4  oz (75 kg)  07/25/21 162 lb 8 oz (73.7 kg)    Physical Exam Vitals and nursing note reviewed.  Constitutional:      Appearance: She is well-developed.  HENT:     Head: Normocephalic and atraumatic.  Cardiovascular:     Rate and Rhythm: Normal rate and regular rhythm.     Heart sounds: Normal heart sounds. No murmur heard.    No friction rub. No gallop.  Pulmonary:     Effort: Pulmonary effort is normal. No tachypnea or respiratory distress.     Breath sounds: Normal breath sounds. No decreased breath  sounds, wheezing, rhonchi or rales.  Chest:     Chest wall: No tenderness.  Abdominal:     General: Bowel sounds are normal.     Palpations: Abdomen is soft.  Musculoskeletal:        General: Normal range of motion.     Cervical back: Normal range of motion.  Skin:    General: Skin is warm and dry.  Neurological:     Mental Status: She is alert and oriented to person, place, and time.     Coordination: Coordination normal.  Psychiatric:        Behavior: Behavior normal. Behavior is cooperative.        Thought Content: Thought content normal.        Judgment: Judgment normal.          Patient has been counseled extensively about nutrition and exercise as well as the importance of adherence with medications and regular follow-up. The patient was given clear instructions to go to ER or return to medical center if symptoms don't improve, worsen or new problems develop. The patient verbalized understanding.   Follow-up: Return in about 4 months (around 09/27/2022) for a1c/CMP.   Claiborne Rigg, FNP-BC [facility] [place], [place] [phone]   06/06/2022, 3:02 PM

## 2022-06-14 ENCOUNTER — Ambulatory Visit: Payer: Self-pay

## 2022-07-05 ENCOUNTER — Ambulatory Visit: Payer: Self-pay | Attending: Nurse Practitioner

## 2022-10-05 ENCOUNTER — Ambulatory Visit: Payer: Self-pay | Admitting: Nurse Practitioner

## 2022-11-08 ENCOUNTER — Other Ambulatory Visit (HOSPITAL_COMMUNITY)
Admission: RE | Admit: 2022-11-08 | Discharge: 2022-11-08 | Disposition: A | Discharge status: Home / Self Care | Payer: Self-pay | Source: Ambulatory Visit | Attending: Nurse Practitioner | Admitting: Nurse Practitioner

## 2022-11-08 ENCOUNTER — Ambulatory Visit: Payer: Self-pay | Attending: Nurse Practitioner | Admitting: Nurse Practitioner

## 2022-11-08 ENCOUNTER — Encounter: Payer: Self-pay | Admitting: Nurse Practitioner

## 2022-11-08 VITALS — BP 135/81 | HR 71 | Ht 61.0 in | Wt 171.0 lb

## 2022-11-08 DIAGNOSIS — N898 Other specified noninflammatory disorders of vagina: Secondary | ICD-10-CM | POA: Insufficient documentation

## 2022-11-08 DIAGNOSIS — R42 Dizziness and giddiness: Secondary | ICD-10-CM

## 2022-11-08 DIAGNOSIS — R7303 Prediabetes: Secondary | ICD-10-CM

## 2022-11-08 DIAGNOSIS — E7800 Pure hypercholesterolemia, unspecified: Secondary | ICD-10-CM

## 2022-11-08 DIAGNOSIS — Z862 Personal history of diseases of the blood and blood-forming organs and certain disorders involving the immune mechanism: Secondary | ICD-10-CM

## 2022-11-08 LAB — POCT GLYCOSYLATED HEMOGLOBIN (HGB A1C): HbA1c, POC (controlled diabetic range): 6 % (ref 0.0–7.0)

## 2022-11-08 NOTE — Progress Notes (Signed)
Antonio- [phone]

## 2022-11-08 NOTE — Progress Notes (Signed)
Assessment & Plan:  Connie Newman was seen today for prediabetes.  Diagnoses and all orders for this visit:  Prediabetes -     POCT glycosylated hemoglobin (Hb A1C) -     CMP14+EGFR  Hypercholesterolemia -     Lipid panel INSTRUCTIONS: Work on a low fat, heart healthy diet and participate in regular aerobic exercise program by working out at least 150 minutes per week; 5 days a week-30 minutes per day. Avoid red meat/beef/steak,  fried foods. junk foods, sodas, sugary drinks, unhealthy snacking, alcohol and smoking.  Drink at least 80 oz of water per day and monitor your carbohydrate intake daily.    History of anemia -     CBC with Differential  Vaginal discharge -     Cervicovaginal ancillary only  Dizziness -     Thyroid Panel With TSH    Patient has been counseled on age-appropriate routine health concerns for screening and prevention. These are reviewed and up-to-date. Referrals have been placed accordingly. Immunizations are up-to-date or declined.    Subjective:   Chief Complaint  Patient presents with   Prediabetes   HPI Connie Newman 52 y.o. female presents to office today for follow up to prediabetes.  VRI was used to communicate directly with patient for the entire encounter including providing detailed patient instructions.    Patient has been counseled on age-appropriate routine health concerns for screening and prevention. These are reviewed and up-to-date. Referrals have been placed accordingly. Immunizations are up-to-date or declined.     MAMMOGRAM: Overdue. Referral placed to [facility]: OVERDUE  Blood pressure is near goal.  BP Readings from Last 3 Encounters:  11/08/22 135/81  06/06/22 (!) 150/78  02/21/22 126/76     Prediabetes Well controlled. She is not taking any diabetic medications.  Lab Results  Component Value Date   HGBA1C 6.0 11/08/2022    Lab Results  Component Value Date   HGBA1C 5.8 (H) 02/21/2022      Endorses increased vaginal fluid or discharge in large amounts in her panties. She denies abdominal pain, hematuria, dysuria  Dizziness Endorsed one time occurrence of dizziness while she was sweeping. Lasted for a few seconds and resolved on its own.    Review of Systems  Constitutional:  Negative for fever, malaise/fatigue and weight loss.  HENT: Negative.  Negative for nosebleeds.   Eyes: Negative.  Negative for blurred vision, double vision and photophobia.  Respiratory: Negative.  Negative for cough and shortness of breath.   Cardiovascular: Negative.  Negative for chest pain, palpitations and leg swelling.  Gastrointestinal: Negative.  Negative for heartburn, nausea and vomiting.  Genitourinary:        Increased vaginal discharge  Musculoskeletal: Negative.  Negative for myalgias.  Neurological:  Positive for dizziness. Negative for focal weakness, seizures and headaches.  Psychiatric/Behavioral: Negative.  Negative for suicidal ideas.     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    has sensation, but unable to move that side    Past Surgical History:  Procedure Laterality Date   CESAREAN SECTION N/A 12/30/2013   Procedure: CESAREAN SECTION;  Surgeon: Emily Filbert, MD;  Location: [facility];  Service: Obstetrics;  Laterality: N/A;   CESAREAN SECTION N/A 12/05/2015   Procedure: CESAREAN SECTION;  Surgeon: Jonnie Kind, MD;  Location: [facility];  Service: Obstetrics;  Laterality: N/A;   NO  PAST SURGERIES      Family History  Problem Relation Age of Onset   Hypertension Mother    Diabetes Father    Diabetes Paternal Grandmother    Diabetes Paternal Grandfather     Social History Reviewed with no changes to be made today.   No outpatient medications prior to visit.   No facility-administered medications prior to visit.    No Known Allergies     Objective:    BP 135/81 (BP  Location: Left Arm, Patient Position: Sitting, Cuff Size: Normal)   Pulse 71   Wt 171 lb (77.6 kg)   LMP 10/17/2022 (Approximate)   SpO2 97%   BMI 32.31 kg/m  Wt Readings from Last 3 Encounters:  11/08/22 171 lb (77.6 kg)  06/06/22 173 lb (78.5 kg)  02/21/22 165 lb 6.4 oz (75 kg)    Physical Exam Vitals and nursing note reviewed.  Constitutional:      Appearance: She is well-developed.  HENT:     Head: Normocephalic and atraumatic.  Cardiovascular:     Rate and Rhythm: Normal rate and regular rhythm.     Heart sounds: Normal heart sounds. No murmur heard.    No friction rub. No gallop.  Pulmonary:     Effort: Pulmonary effort is normal. No tachypnea or respiratory distress.     Breath sounds: Normal breath sounds. No decreased breath sounds, wheezing, rhonchi or rales.  Chest:     Chest wall: No tenderness.  Abdominal:     General: Bowel sounds are normal.     Palpations: Abdomen is soft.  Musculoskeletal:        General: Normal range of motion.     Cervical back: Normal range of motion.  Skin:    General: Skin is warm and dry.  Neurological:     Mental Status: She is alert and oriented to person, place, and time.     Coordination: Coordination normal.  Psychiatric:        Behavior: Behavior normal. Behavior is cooperative.        Thought Content: Thought content normal.        Judgment: Judgment normal.          Patient has been counseled extensively about nutrition and exercise as well as the importance of adherence with medications and regular follow-up. The patient was given clear instructions to go to ER or return to medical center if symptoms don't improve, worsen or new problems develop. The patient verbalized understanding.   Follow-up: Return in about 6 months (around 05/10/2023).   Gildardo Pounds, FNP-BC [facility] and [facility] [place], [place]   11/08/2022, 2:10 PM

## 2022-11-09 ENCOUNTER — Ambulatory Visit: Payer: Self-pay | Attending: Family Medicine

## 2022-11-09 LAB — CMP14+EGFR
ALT: 21 IU/L (ref 0–32)
AST: 18 IU/L (ref 0–40)
Albumin/Globulin Ratio: 1.7 (ref 1.2–2.2)
Albumin: 4.5 g/dL (ref 3.9–4.9)
Alkaline Phosphatase: 81 IU/L (ref 44–121)
BUN/Creatinine Ratio: 17 (ref 9–23)
BUN: 13 mg/dL (ref 6–24)
Bilirubin Total: 0.4 mg/dL (ref 0.0–1.2)
CO2: 22 mmol/L (ref 20–29)
Calcium: 9 mg/dL (ref 8.7–10.2)
Chloride: 99 mmol/L (ref 96–106)
Creatinine, Ser: 0.78 mg/dL (ref 0.57–1.00)
Globulin, Total: 2.6 g/dL (ref 1.5–4.5)
Glucose: 92 mg/dL (ref 70–99)
Potassium: 3.9 mmol/L (ref 3.5–5.2)
Sodium: 136 mmol/L (ref 134–144)
Total Protein: 7.1 g/dL (ref 6.0–8.5)
eGFR: 92 mL/min/{1.73_m2} (ref >59)

## 2022-11-09 LAB — THYROID PANEL WITH TSH
Free Thyroxine Index: 1.7 (ref 1.2–4.9)
T3 Uptake Ratio: 23 % — ABNORMAL LOW (ref 24–39)
T4, Total: 7.5 ug/dL (ref 4.5–12.0)
TSH: 1.57 u[IU]/mL (ref 0.450–4.500)

## 2022-11-09 LAB — CBC WITH DIFFERENTIAL/PLATELET
Basophils Absolute: 0.1 10*3/uL (ref 0.0–0.2)
Basos: 1 %
EOS (ABSOLUTE): 0.2 10*3/uL (ref 0.0–0.4)
Eos: 3 %
Hematocrit: 36.6 % (ref 34.0–46.6)
Hemoglobin: 12.5 g/dL (ref 11.1–15.9)
Immature Grans (Abs): 0 10*3/uL (ref 0.0–0.1)
Immature Granulocytes: 0 %
Lymphocytes Absolute: 2.1 10*3/uL (ref 0.7–3.1)
Lymphs: 27 %
MCH: 29.5 pg (ref 26.6–33.0)
MCHC: 34.2 g/dL (ref 31.5–35.7)
MCV: 86 fL (ref 79–97)
Monocytes Absolute: 0.5 10*3/uL (ref 0.1–0.9)
Monocytes: 7 %
Neutrophils Absolute: 4.9 10*3/uL (ref 1.4–7.0)
Neutrophils: 62 %
Platelets: 285 10*3/uL (ref 150–450)
RBC: 4.24 x10E6/uL (ref 3.77–5.28)
RDW: 12 % (ref 11.7–15.4)
WBC: 7.9 10*3/uL (ref 3.4–10.8)

## 2022-11-09 LAB — CERVICOVAGINAL ANCILLARY ONLY
Bacterial Vaginitis (gardnerella): NEGATIVE
Candida Glabrata: POSITIVE — AB
Candida Vaginitis: POSITIVE — AB
Chlamydia: NEGATIVE
Comment: NEGATIVE
Comment: NEGATIVE
Comment: NEGATIVE
Comment: NEGATIVE
Comment: NEGATIVE
Comment: NORMAL
Neisseria Gonorrhea: NEGATIVE
Trichomonas: NEGATIVE

## 2022-11-09 LAB — LIPID PANEL
Chol/HDL Ratio: 3.4 ratio (ref 0.0–4.4)
Cholesterol, Total: 230 mg/dL — ABNORMAL HIGH (ref 100–199)
HDL: 68 mg/dL (ref >39)
LDL Chol Calc (NIH): 130 mg/dL — ABNORMAL HIGH (ref 0–99)
Triglycerides: 182 mg/dL — ABNORMAL HIGH (ref 0–149)
VLDL Cholesterol Cal: 32 mg/dL (ref 5–40)

## 2022-11-11 ENCOUNTER — Other Ambulatory Visit: Payer: Self-pay | Admitting: Nurse Practitioner

## 2022-11-11 MED ORDER — FLUCONAZOLE 150 MG PO TABS: 150.0000 mg | ORAL_TABLET | Freq: Once | ORAL | 0 refills | Status: AC

## 2022-11-15 ENCOUNTER — Telehealth: Payer: Self-pay

## 2022-11-15 NOTE — Telephone Encounter (Signed)
Telephoned patient at mobile number using interpreter [phone]. Left a voice message with [employer] (scholarship) contact information.

## 2022-11-21 ENCOUNTER — Telehealth: Payer: Self-pay | Admitting: Nurse Practitioner

## 2022-11-21 NOTE — Telephone Encounter (Signed)
Pt's spouse to apply for CAFA - return completed app to financial counselor by April 30th (req docs already submitted w/ [facility])   Rec'vd pt's spouse's CAFA app and submitted - pt can expect to receive letter in mail within 2-4 weeks (please disregard my prior msg)

## 2022-12-06 ENCOUNTER — Ambulatory Visit
Admission: RE | Admit: 2022-12-06 | Discharge: 2022-12-06 | Disposition: A | Discharge status: Home / Self Care | Payer: No Typology Code available for payment source | Source: Ambulatory Visit | Attending: Nurse Practitioner | Admitting: Nurse Practitioner

## 2022-12-06 DIAGNOSIS — Z1231 Encounter for screening mammogram for malignant neoplasm of breast: Secondary | ICD-10-CM

## 2023-05-10 ENCOUNTER — Other Ambulatory Visit: Payer: Self-pay

## 2023-05-10 ENCOUNTER — Encounter: Payer: Self-pay | Admitting: Nurse Practitioner

## 2023-05-10 ENCOUNTER — Ambulatory Visit: Payer: Self-pay | Attending: Nurse Practitioner | Admitting: Nurse Practitioner

## 2023-05-10 VITALS — BP 141/83 | HR 78 | Ht 61.0 in | Wt 170.0 lb

## 2023-05-10 DIAGNOSIS — Z23 Encounter for immunization: Secondary | ICD-10-CM

## 2023-05-10 DIAGNOSIS — N939 Abnormal uterine and vaginal bleeding, unspecified: Secondary | ICD-10-CM

## 2023-05-10 DIAGNOSIS — R232 Flushing: Secondary | ICD-10-CM

## 2023-05-10 DIAGNOSIS — I10 Essential (primary) hypertension: Secondary | ICD-10-CM

## 2023-05-10 MED ORDER — PAROXETINE HCL 10 MG PO TABS
10.0000 mg | ORAL_TABLET | Freq: Every evening | ORAL | 1 refills | Status: DC
Start: 2023-05-10 — End: 2023-07-15
  Filled 2023-05-10: qty 30, 30d supply, fill #0
  Filled 2023-06-18: qty 30, 30d supply, fill #1

## 2023-05-10 MED ORDER — HYDROCHLOROTHIAZIDE 25 MG PO TABS
25.0000 mg | ORAL_TABLET | Freq: Every day | ORAL | 3 refills | Status: DC
Start: 2023-05-10 — End: 2024-06-01
  Filled 2023-05-10: qty 30, 30d supply, fill #0
  Filled 2023-06-18 (×2): qty 30, 30d supply, fill #1
  Filled 2023-07-15: qty 90, 90d supply, fill #2
  Filled 2023-08-02: qty 30, 30d supply, fill #2
  Filled 2023-10-08 (×2): qty 30, 30d supply, fill #3
  Filled 2024-01-31: qty 90, 90d supply, fill #4
  Filled 2024-04-27: qty 90, 90d supply, fill #5

## 2023-05-10 NOTE — Progress Notes (Signed)
Assessment & Plan:  [name] was seen today for medical management of chronic issues.  Diagnoses and all orders for this visit:  Abnormal uterine bleeding (AUB) -     US PELVIC COMPLETE WITH TRANSVAGINAL; Future  Primary hypertension -     hydrochlorothiazide (HYDRODIURIL) 25 MG tablet; Take 1 tablet (25 mg total) by mouth daily. Continue all antihypertensives as prescribed.  Reminded to bring in blood pressure log for follow  up appointment.  RECOMMENDATIONS: DASH/Mediterranean Diets are healthier choices for HTN.    Hot flashes -     PARoxetine (PAXIL) 10 MG tablet; Take 1 tablet (10 mg total) by mouth at bedtime. For hot flashes    Patient has been counseled on age-appropriate routine health concerns for screening and prevention. These are reviewed and up-to-date. Referrals have been placed accordingly. Immunizations are up-to-date or declined.    Subjective:   Chief Complaint  Patient presents with   Medical Management of Chronic Issues    Heavy bleeding   HPI Connie Newman 52 y.o. female presents to office today with concerns of heavy menstrual bleeding.   VRI was used to communicate directly with patient for the entire encounter including providing detailed patient instructions.    AUB States menstrual cycles have increased in duration and flow heaviness (previously 5 days and now 7 days). Days 1 and 2 are the heaviest. Symptoms started several  months ago. Could likely be related to perimenopause as she also reports hot flashes but will order pelvic US to r/o any structural abnormalities. Associated symptoms include hot flashes.    Blood pressure is elevated. It has been above goal for several months now. Will need to start antihypertensive at this time.  BP Readings from Last 3 Encounters:  05/10/23 (!) 141/83  11/08/22 135/81  06/06/22 (!) 150/78       Review of Systems  Constitutional:  Negative for fever, malaise/fatigue and weight loss.       Hot  flashes  HENT: Negative.  Negative for nosebleeds.   Eyes: Negative.  Negative for blurred vision, double vision and photophobia.  Respiratory: Negative.  Negative for cough and shortness of breath.   Cardiovascular: Negative.  Negative for chest pain, palpitations and leg swelling.  Gastrointestinal: Negative.  Negative for heartburn, nausea and vomiting.  Genitourinary:        SEE HPI  Musculoskeletal: Negative.  Negative for myalgias.  Neurological: Negative.  Negative for dizziness, focal weakness, seizures and headaches.  Psychiatric/Behavioral: Negative.  Negative for suicidal ideas.     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    has sensation, but unable to move that side    Past Surgical History:  Procedure Laterality Date   CESAREAN SECTION N/A 12/30/2013   Procedure: CESAREAN SECTION;  Surgeon: Allie Bossier, MD;  Location: [facility];  Service: Obstetrics;  Laterality: N/A;   CESAREAN SECTION N/A 12/05/2015   Procedure: CESAREAN SECTION;  Surgeon: Tilda Burrow, MD;  Location: [facility];  Service: Obstetrics;  Laterality: N/A;   NO PAST SURGERIES      Family History  Problem Relation Age of Onset   Hypertension Mother    Diabetes Father    Diabetes Paternal Grandmother    Diabetes Paternal Grandfather    Breast cancer Neg Hx     Social History Reviewed with no changes to be made today.   No  outpatient medications prior to visit.   No facility-administered medications prior to visit.    No Known Allergies     Objective:    BP (!) 141/83   Pulse 78   Ht 5\' 1"  (1.549 m)   Wt 170 lb (77.1 kg)   SpO2 97%   BMI 32.12 kg/m  Wt Readings from Last 3 Encounters:  05/10/23 170 lb (77.1 kg)  11/08/22 171 lb (77.6 kg)  06/06/22 173 lb (78.5 kg)    Physical Exam Vitals and nursing note reviewed.  Constitutional:      Appearance: She is well-developed.  HENT:     Head:  Normocephalic and atraumatic.  Cardiovascular:     Rate and Rhythm: Normal rate and regular rhythm.     Heart sounds: Normal heart sounds. No murmur heard.    No friction rub. No gallop.  Pulmonary:     Effort: Pulmonary effort is normal. No tachypnea or respiratory distress.     Breath sounds: Normal breath sounds. No decreased breath sounds, wheezing, rhonchi or rales.  Chest:     Chest wall: No tenderness.  Abdominal:     General: Bowel sounds are normal.     Palpations: Abdomen is soft.  Musculoskeletal:        General: Normal range of motion.     Cervical back: Normal range of motion.  Skin:    General: Skin is warm and dry.  Neurological:     Mental Status: She is alert and oriented to person, place, and time.     Coordination: Coordination normal.  Psychiatric:        Behavior: Behavior normal. Behavior is cooperative.        Thought Content: Thought content normal.        Judgment: Judgment normal.          Patient has been counseled extensively about nutrition and exercise as well as the importance of adherence with medications and regular follow-up. The patient was given clear instructions to go to ER or return to medical center if symptoms don't improve, worsen or new problems develop. The patient verbalized understanding.   Follow-up: Return in about 4 weeks (around 06/07/2023) for BP check 4-6 weeks.   Claiborne Rigg, FNP-BC [facility] and [facility] [place], [place] [phone]   05/10/2023, 2:43 PM

## 2023-05-16 ENCOUNTER — Ambulatory Visit (HOSPITAL_COMMUNITY)
Admission: RE | Admit: 2023-05-16 | Discharge: 2023-05-16 | Disposition: A | Discharge status: Home / Self Care | Payer: No Typology Code available for payment source | Source: Ambulatory Visit | Attending: Nurse Practitioner | Admitting: Nurse Practitioner

## 2023-05-16 DIAGNOSIS — N939 Abnormal uterine and vaginal bleeding, unspecified: Secondary | ICD-10-CM | POA: Insufficient documentation

## 2023-06-18 ENCOUNTER — Ambulatory Visit: Payer: Self-pay | Attending: Nurse Practitioner | Admitting: Nurse Practitioner

## 2023-06-18 ENCOUNTER — Other Ambulatory Visit: Payer: Self-pay

## 2023-06-18 VITALS — BP 126/78 | HR 70 | Ht 61.0 in | Wt 165.8 lb

## 2023-06-18 DIAGNOSIS — Z1211 Encounter for screening for malignant neoplasm of colon: Secondary | ICD-10-CM

## 2023-06-18 DIAGNOSIS — I10 Essential (primary) hypertension: Secondary | ICD-10-CM

## 2023-06-18 NOTE — Progress Notes (Signed)
Assessment & Plan:  Connie Newman was seen today for blood pressure check.  Diagnoses and all orders for this visit:  Primary hypertension Needs to take hydrochlorothiazide as prescribed. As she has only been taking for one week and did not start 4 weeks ago as instructed.  Colon cancer screening -     Fecal occult blood, imunochemical([facility])    Patient has been counseled on age-appropriate routine health concerns for screening and prevention. These are reviewed and up-to-date. Referrals have been placed accordingly. Immunizations are up-to-date or declined.    Subjective:   Chief Complaint  Patient presents with   Blood Pressure Check    Connie Newman 52 y.o. female presents to office today for blood pressure check  VRI was used to communicate directly with patient for the entire encounter including providing detailed patient instructions.     HTN She was started on hydrochlorothiazide 25 mg daily at her last office visit for elevated blood pressure reading. She just started taking it last week. She also made dietary changes and has been exercising.  Today blood pressure is normal and she has been taking hydrochlorothiazide with no side effects.  BP Readings from Last 3 Encounters:  06/18/23 126/78  05/10/23 (!) 141/83  11/08/22 135/81    Hot flashes She was also prescribed Paxil for hot flashes at her last visit with me.    Review of Systems  Constitutional:  Negative for fever, malaise/fatigue and weight loss.  HENT: Negative.  Negative for nosebleeds.   Eyes: Negative.  Negative for blurred vision, double vision and photophobia.  Respiratory: Negative.  Negative for cough and shortness of breath.   Cardiovascular: Negative.  Negative for chest pain, palpitations and leg swelling.  Gastrointestinal: Negative.  Negative for heartburn, nausea and vomiting.  Musculoskeletal: Negative.  Negative for myalgias.  Neurological: Negative.  Negative for  dizziness, focal weakness, seizures and headaches.  Psychiatric/Behavioral: Negative.  Negative for suicidal ideas.     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    has sensation, but unable to move that side    Past Surgical History:  Procedure Laterality Date   CESAREAN SECTION N/A 12/30/2013   Procedure: CESAREAN SECTION;  Surgeon: Allie Bossier, MD;  Location: [facility];  Service: Obstetrics;  Laterality: N/A;   CESAREAN SECTION N/A 12/05/2015   Procedure: CESAREAN SECTION;  Surgeon: Tilda Burrow, MD;  Location: [facility];  Service: Obstetrics;  Laterality: N/A;   NO PAST SURGERIES      Family History  Problem Relation Age of Onset   Hypertension Mother    Diabetes Father    Diabetes Paternal Grandmother    Diabetes Paternal Grandfather    Breast cancer Neg Hx     Social History Reviewed with no changes to be made today.   Outpatient Medications Prior to Visit  Medication Sig Dispense Refill   hydrochlorothiazide (HYDRODIURIL) 25 MG tablet Take 1 tablet (25 mg total) by mouth daily. 90 tablet 3   PARoxetine (PAXIL) 10 MG tablet Take 1 tablet (10 mg total) by mouth at bedtime. For hot flashes 30 tablet 1   No facility-administered medications prior to visit.    No Known Allergies     Objective:    BP 126/78 (BP Location: Left Arm, Patient Position: Sitting, Cuff Size: Normal)   Pulse 70   Ht 5\' 1"  (1.549 m)   Wt  165 lb 12.8 oz (75.2 kg)   LMP 05/07/2023 (Approximate)   SpO2 98%   BMI 31.33 kg/m  Wt Readings from Last 3 Encounters:  06/18/23 165 lb 12.8 oz (75.2 kg)  05/10/23 170 lb (77.1 kg)  11/08/22 171 lb (77.6 kg)    Physical Exam Vitals and nursing note reviewed.  Constitutional:      Appearance: She is well-developed.  HENT:     Head: Normocephalic and atraumatic.  Cardiovascular:     Rate and Rhythm: Normal rate and regular rhythm.     Heart sounds: Normal heart  sounds. No murmur heard.    No friction rub. No gallop.  Pulmonary:     Effort: Pulmonary effort is normal. No tachypnea or respiratory distress.     Breath sounds: Normal breath sounds. No decreased breath sounds, wheezing, rhonchi or rales.  Chest:     Chest wall: No tenderness.  Abdominal:     General: Bowel sounds are normal.     Palpations: Abdomen is soft.  Musculoskeletal:        General: Normal range of motion.     Cervical back: Normal range of motion.  Skin:    General: Skin is warm and dry.  Neurological:     Mental Status: She is alert and oriented to person, place, and time.     Coordination: Coordination normal.  Psychiatric:        Behavior: Behavior normal. Behavior is cooperative.        Thought Content: Thought content normal.        Judgment: Judgment normal.          Patient has been counseled extensively about nutrition and exercise as well as the importance of adherence with medications and regular follow-up. The patient was given clear instructions to go to ER or return to medical center if symptoms don't improve, worsen or new problems develop. The patient verbalized understanding.   Follow-up: Return in about 6 weeks (around 07/30/2023) for HTN.   Claiborne Rigg, FNP-BC [facility] and Wellness [place], [place] [phone]   06/18/2023, 10:10 PM

## 2023-06-22 LAB — FECAL OCCULT BLOOD, IMMUNOCHEMICAL: Fecal Occult Bld: NEGATIVE

## 2023-07-15 ENCOUNTER — Other Ambulatory Visit: Payer: Self-pay | Admitting: Nurse Practitioner

## 2023-07-15 ENCOUNTER — Other Ambulatory Visit: Payer: Self-pay

## 2023-07-15 DIAGNOSIS — R232 Flushing: Secondary | ICD-10-CM

## 2023-07-15 MED ORDER — PAROXETINE HCL 10 MG PO TABS
10.0000 mg | ORAL_TABLET | Freq: Every evening | ORAL | 1 refills | Status: DC
Start: 2023-07-15 — End: 2023-12-06
  Filled 2023-07-15: qty 30, 30d supply, fill #0

## 2023-07-25 ENCOUNTER — Other Ambulatory Visit: Payer: Self-pay

## 2023-08-02 ENCOUNTER — Other Ambulatory Visit: Payer: Self-pay

## 2023-08-19 ENCOUNTER — Ambulatory Visit: Payer: Self-pay | Admitting: Nurse Practitioner

## 2023-09-03 ENCOUNTER — Ambulatory Visit: Payer: Self-pay | Attending: Nurse Practitioner | Admitting: Nurse Practitioner

## 2023-09-03 VITALS — BP 127/82 | HR 83 | Resp 19 | Ht 61.0 in | Wt 167.6 lb

## 2023-09-03 DIAGNOSIS — I10 Essential (primary) hypertension: Secondary | ICD-10-CM

## 2023-09-03 DIAGNOSIS — N951 Menopausal and female climacteric states: Secondary | ICD-10-CM

## 2023-09-03 DIAGNOSIS — E7800 Pure hypercholesterolemia, unspecified: Secondary | ICD-10-CM

## 2023-09-03 DIAGNOSIS — R946 Abnormal results of thyroid function studies: Secondary | ICD-10-CM

## 2023-09-03 NOTE — Patient Instructions (Addendum)
Estroven complete multi symptom for hair loss  Biotin for hair loss at least 10,000 mg

## 2023-09-03 NOTE — Progress Notes (Signed)
Assessment & Plan:  Connie Newman was seen today for medical management of chronic issues.  Diagnoses and all orders for this visit:  Primary hypertension -     CMP14+EGFR Continue hydrochlorothiazide as prescribed.  Reminded to bring in blood pressure log for follow  up appointment.  RECOMMENDATIONS: DASH/Mediterranean Diets are healthier choices for HTN.    Perimenopause -     Ambulatory referral to Gynecology  Abnormal thyroid exam -     Thyroid Panel With TSH  Hypercholesterolemia -     Lipid panel INSTRUCTIONS: Work on a low fat, heart healthy diet and participate in regular aerobic exercise program by working out at least 150 minutes per week; 5 days a week-30 minutes per day. Avoid red meat/beef/steak,  fried foods. junk foods, sodas, sugary drinks, unhealthy snacking, alcohol and smoking.  Drink at least 80 oz of water per day and monitor your carbohydrate intake daily.      Patient has been counseled on age-appropriate routine health concerns for screening and prevention. These are reviewed and up-to-date. Referrals have been placed accordingly. Immunizations are up-to-date or declined.    Subjective:   Chief Complaint  Patient presents with   Medical Management of Chronic Issues    Connie Newman 53 y.o. female presents to office today for follow up to HTN    HTN Blood pressure is well controlled with hydrochlorothiazide 25 mg daily.  BP Readings from Last 3 Encounters:  09/03/23 127/82  06/18/23 126/78  05/10/23 (!) 141/83     She is in perimenopause. Experiencing decreased sexual desire, hair thinning hot flashes. Would like something for her sex drive.    Review of Systems  Constitutional:  Negative for fever, malaise/fatigue and weight loss.       SEE HPI  HENT: Negative.  Negative for nosebleeds.   Eyes: Negative.  Negative for blurred vision, double vision and photophobia.  Respiratory: Negative.  Negative for cough and shortness of breath.    Cardiovascular: Negative.  Negative for chest pain, palpitations and leg swelling.  Gastrointestinal: Negative.  Negative for heartburn, nausea and vomiting.  Musculoskeletal: Negative.  Negative for myalgias.  Neurological: Negative.  Negative for dizziness, focal weakness, seizures and headaches.  Psychiatric/Behavioral: Negative.  Negative for suicidal ideas.     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    has sensation, but unable to move that side    Past Surgical History:  Procedure Laterality Date   CESAREAN SECTION N/A 12/30/2013   Procedure: CESAREAN SECTION;  Surgeon: Allie Bossier, MD;  Location: [facility];  Service: Obstetrics;  Laterality: N/A;   CESAREAN SECTION N/A 12/05/2015   Procedure: CESAREAN SECTION;  Surgeon: Tilda Burrow, MD;  Location: [facility];  Service: Obstetrics;  Laterality: N/A;   NO PAST SURGERIES      Family History  Problem Relation Age of Onset   Hypertension Mother    Diabetes Father    Diabetes Paternal Grandmother    Diabetes Paternal Grandfather    Breast cancer Neg Hx     Social History Reviewed with no changes to be made today.   Outpatient Medications Prior to Visit  Medication Sig Dispense Refill   hydrochlorothiazide (HYDRODIURIL) 25 MG tablet Take 1 tablet (25 mg total) by mouth daily. 90 tablet 3   PARoxetine (PAXIL) 10 MG tablet Take 1 tablet (10 mg total) by mouth at bedtime.  For hot flashes (Patient not taking: Reported on 09/03/2023) 30 tablet 1   No facility-administered medications prior to visit.    No Known Allergies     Objective:    BP 127/82 (BP Location: Left Arm, Patient Position: Sitting, Cuff Size: Normal)   Pulse 83   Resp 19   Ht 5\' 1"  (1.549 m)   Wt 167 lb 9.6 oz (76 kg)   LMP 07/30/2023 (Exact Date)   SpO2 100%   Breastfeeding No   BMI 31.67 kg/m  Wt Readings from Last 3 Encounters:  09/03/23 167 lb 9.6 oz (76 kg)   06/18/23 165 lb 12.8 oz (75.2 kg)  05/10/23 170 lb (77.1 kg)    Physical Exam Vitals and nursing note reviewed.  Constitutional:      Appearance: She is well-developed.  HENT:     Head: Normocephalic and atraumatic.  Cardiovascular:     Rate and Rhythm: Normal rate and regular rhythm.     Heart sounds: Normal heart sounds. No murmur heard.    No friction rub. No gallop.  Pulmonary:     Effort: Pulmonary effort is normal. No tachypnea or respiratory distress.     Breath sounds: Normal breath sounds. No decreased breath sounds, wheezing, rhonchi or rales.  Chest:     Chest wall: No tenderness.  Abdominal:     General: Bowel sounds are normal.     Palpations: Abdomen is soft.  Musculoskeletal:        General: Normal range of motion.     Cervical back: Normal range of motion.  Skin:    General: Skin is warm and dry.  Neurological:     Mental Status: She is alert and oriented to person, place, and time.     Coordination: Coordination normal.  Psychiatric:        Behavior: Behavior normal. Behavior is cooperative.        Thought Content: Thought content normal.        Judgment: Judgment normal.          Patient has been counseled extensively about nutrition and exercise as well as the importance of adherence with medications and regular follow-up. The patient was given clear instructions to go to ER or return to medical center if symptoms don't improve, worsen or new problems develop. The patient verbalized understanding.   Follow-up: Return in about 3 months (around 12/02/2023).   Claiborne Rigg, FNP-BC [facility] and [facility] [place], [place] [phone]   09/03/2023, 5:00 PM

## 2023-09-04 LAB — LIPID PANEL
Chol/HDL Ratio: 4.3 {ratio} (ref 0.0–4.4)
Cholesterol, Total: 273 mg/dL — ABNORMAL HIGH (ref 100–199)
HDL: 64 mg/dL (ref >39)
LDL Chol Calc (NIH): 169 mg/dL — ABNORMAL HIGH (ref 0–99)
Triglycerides: 215 mg/dL — ABNORMAL HIGH (ref 0–149)
VLDL Cholesterol Cal: 40 mg/dL (ref 5–40)

## 2023-09-04 LAB — CMP14+EGFR
ALT: 22 [IU]/L (ref 0–32)
AST: 21 [IU]/L (ref 0–40)
Albumin: 4.2 g/dL (ref 3.8–4.9)
Alkaline Phosphatase: 90 [IU]/L (ref 44–121)
BUN/Creatinine Ratio: 25 — ABNORMAL HIGH (ref 9–23)
BUN: 16 mg/dL (ref 6–24)
Bilirubin Total: 0.3 mg/dL (ref 0.0–1.2)
CO2: 22 mmol/L (ref 20–29)
Calcium: 8.8 mg/dL (ref 8.7–10.2)
Chloride: 102 mmol/L (ref 96–106)
Creatinine, Ser: 0.64 mg/dL (ref 0.57–1.00)
Globulin, Total: 2.8 g/dL (ref 1.5–4.5)
Glucose: 153 mg/dL — ABNORMAL HIGH (ref 70–99)
Potassium: 3.8 mmol/L (ref 3.5–5.2)
Sodium: 141 mmol/L (ref 134–144)
Total Protein: 7 g/dL (ref 6.0–8.5)
eGFR: 107 mL/min/{1.73_m2} (ref >59)

## 2023-09-04 LAB — THYROID PANEL WITH TSH
Free Thyroxine Index: 1.4 (ref 1.2–4.9)
T3 Uptake Ratio: 21 % — ABNORMAL LOW (ref 24–39)
T4, Total: 6.8 ug/dL (ref 4.5–12.0)
TSH: 1.18 u[IU]/mL (ref 0.450–4.500)

## 2023-09-05 ENCOUNTER — Encounter: Payer: Self-pay | Admitting: Nurse Practitioner

## 2023-10-08 ENCOUNTER — Other Ambulatory Visit: Payer: Self-pay

## 2023-10-09 ENCOUNTER — Other Ambulatory Visit: Payer: Self-pay

## 2023-12-06 ENCOUNTER — Other Ambulatory Visit: Payer: Self-pay | Admitting: Nurse Practitioner

## 2023-12-06 ENCOUNTER — Encounter: Payer: Self-pay | Admitting: Nurse Practitioner

## 2023-12-06 ENCOUNTER — Ambulatory Visit: Payer: Self-pay | Attending: Nurse Practitioner | Admitting: Nurse Practitioner

## 2023-12-06 VITALS — BP 134/82 | HR 79 | Resp 19 | Ht 61.0 in | Wt 166.0 lb

## 2023-12-06 DIAGNOSIS — R7303 Prediabetes: Secondary | ICD-10-CM

## 2023-12-06 DIAGNOSIS — E7800 Pure hypercholesterolemia, unspecified: Secondary | ICD-10-CM

## 2023-12-06 DIAGNOSIS — Z1231 Encounter for screening mammogram for malignant neoplasm of breast: Secondary | ICD-10-CM

## 2023-12-06 DIAGNOSIS — I10 Essential (primary) hypertension: Secondary | ICD-10-CM

## 2023-12-06 DIAGNOSIS — Z862 Personal history of diseases of the blood and blood-forming organs and certain disorders involving the immune mechanism: Secondary | ICD-10-CM

## 2023-12-06 NOTE — Progress Notes (Signed)
 Assessment & Plan:  Connie Newman was seen today for hypertension.  Diagnoses and all orders for this visit:  Primary hypertension -     CMP14+EGFR  Prediabetes -     Hemoglobin A1c -     CMP14+EGFR  Hypercholesterolemia -     Lipid panel  History of anemia -     CBC with Differential  Breast cancer screening by mammogram -     MM 3D SCREENING MAMMOGRAM BILATERAL BREAST; Future    Patient has been counseled on age-appropriate routine health concerns for screening and prevention. These are reviewed and up-to-date. Referrals have been placed accordingly. Immunizations are up-to-date or declined.    Subjective:   Chief Complaint  Patient presents with   Hypertension    Connie Newman 53 y.o. 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   She endorses blurred vision in both eyes. Has not had an eye exam. Vision is blurred with attempting to read words on her cell phone but not with watching television.   Patient has been counseled on age-appropriate routine health concerns for screening and prevention. These are reviewed and up-to-date. Referrals have been placed accordingly. Immunizations are up-to-date or declined.     Mammogram: Overdue.  Referral placed Pap smear: Up-to-date  Colon cancer screening: Up-to-date  HTN Blood pressure is well-controlled with HCTZ 25 mg daily as prescribed BP Readings from Last 3 Encounters:  12/06/23 134/82  09/03/23 127/82  06/18/23 126/78     Review of Systems  Constitutional:  Negative for fever, malaise/fatigue and weight loss.  HENT: Negative.  Negative for nosebleeds.   Eyes: Negative.  Negative for blurred vision, double vision and photophobia.  Respiratory: Negative.  Negative for cough and shortness of breath.   Cardiovascular: Negative.  Negative for chest pain, palpitations and leg  swelling.  Gastrointestinal: Negative.  Negative for heartburn, nausea and vomiting.  Musculoskeletal: Negative.  Negative for myalgias.  Neurological: Negative.  Negative for dizziness, focal weakness, seizures and headaches.  Psychiatric/Behavioral: Negative.  Negative for suicidal ideas.     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    has sensation, but unable to move that side    Past Surgical History:  Procedure Laterality Date   CESAREAN SECTION N/A 12/30/2013   Procedure: CESAREAN SECTION;  Surgeon: Ana Balling, MD;  Location: [facility];  Service: Obstetrics;  Laterality: N/A;   CESAREAN SECTION N/A 12/05/2015   Procedure: CESAREAN SECTION;  Surgeon: Albino Hum, MD;  Location: [facility];  Service: Obstetrics;  Laterality: N/A;   NO PAST SURGERIES      Family History  Problem Relation Age of Onset   Hypertension Mother    Diabetes Father    Diabetes Paternal Grandmother    Diabetes Paternal Grandfather    Breast cancer Neg Hx     Social History Reviewed with no changes to be made today.   Outpatient Medications Prior to Visit  Medication Sig Dispense Refill   hydrochlorothiazide  (HYDRODIURIL ) 25 MG tablet Take 1 tablet (25 mg total) by mouth daily. 90 tablet 3   PARoxetine  (PAXIL ) 10 MG tablet Take 1 tablet (10 mg total) by mouth at bedtime. For hot flashes (Patient not taking: Reported on 12/06/2023) 30 tablet 1  No facility-administered medications prior to visit.    No Known Allergies     Objective:    BP 134/82 (BP Location: Left Arm, Patient Position: Sitting)   Pulse 79   Resp 19   Ht 5\' 1"  (1.549 m)   Wt 166 lb (75.3 kg)   LMP 09/09/2023 (Approximate)   SpO2 100%   BMI 31.37 kg/m  Wt Readings from Last 3 Encounters:  12/06/23 166 lb (75.3 kg)  09/03/23 167 lb 9.6 oz (76 kg)  06/18/23 165 lb 12.8 oz (75.2 kg)    Physical Exam Vitals and nursing note reviewed.   Constitutional:      Appearance: She is well-developed.  HENT:     Head: Normocephalic and atraumatic.  Cardiovascular:     Rate and Rhythm: Normal rate and regular rhythm.     Heart sounds: Normal heart sounds. No murmur heard.    No friction rub. No gallop.  Pulmonary:     Effort: Pulmonary effort is normal. No tachypnea or respiratory distress.     Breath sounds: Normal breath sounds. No decreased breath sounds, wheezing, rhonchi or rales.  Chest:     Chest wall: No tenderness.  Abdominal:     General: Bowel sounds are normal.     Palpations: Abdomen is soft.  Musculoskeletal:        General: Normal range of motion.     Cervical back: Normal range of motion.  Skin:    General: Skin is warm and dry.  Neurological:     Mental Status: She is alert and oriented to person, place, and time.     Cranial Nerves: Facial asymmetry (chronic) present.     Coordination: Coordination normal.  Psychiatric:        Behavior: Behavior normal. Behavior is cooperative.        Thought Content: Thought content normal.        Judgment: Judgment normal.          Patient has been counseled extensively about nutrition and exercise as well as the importance of adherence with medications and regular follow-up. The patient was given clear instructions to go to ER or return to medical center if symptoms don't improve, worsen or new problems develop. The patient verbalized understanding.   Follow-up: Return in about 6 months (around 06/07/2024).   Collins Dean, FNP-BC [facility] [place], [place] [phone]   12/06/2023, 4:55 PM

## 2023-12-07 ENCOUNTER — Encounter: Payer: Self-pay | Admitting: Nurse Practitioner

## 2023-12-07 LAB — CBC WITH DIFFERENTIAL/PLATELET
Basophils Absolute: 0.1 10*3/uL (ref 0.0–0.2)
Basos: 1 %
EOS (ABSOLUTE): 0.2 10*3/uL (ref 0.0–0.4)
Eos: 2 %
Hematocrit: 36.1 % (ref 34.0–46.6)
Hemoglobin: 12.4 g/dL (ref 11.1–15.9)
Immature Grans (Abs): 0 10*3/uL (ref 0.0–0.1)
Immature Granulocytes: 0 %
Lymphocytes Absolute: 2 10*3/uL (ref 0.7–3.1)
Lymphs: 30 %
MCH: 30 pg (ref 26.6–33.0)
MCHC: 34.3 g/dL (ref 31.5–35.7)
MCV: 87 fL (ref 79–97)
Monocytes Absolute: 0.3 10*3/uL (ref 0.1–0.9)
Monocytes: 5 %
Neutrophils Absolute: 4 10*3/uL (ref 1.4–7.0)
Neutrophils: 62 %
Platelets: 277 10*3/uL (ref 150–450)
RBC: 4.13 x10E6/uL (ref 3.77–5.28)
RDW: 12 % (ref 11.7–15.4)
WBC: 6.5 10*3/uL (ref 3.4–10.8)

## 2023-12-07 LAB — CMP14+EGFR
ALT: 28 IU/L (ref 0–32)
AST: 19 IU/L (ref 0–40)
Albumin: 4.5 g/dL (ref 3.8–4.9)
Alkaline Phosphatase: 81 IU/L (ref 44–121)
BUN/Creatinine Ratio: 20 (ref 9–23)
BUN: 15 mg/dL (ref 6–24)
Bilirubin Total: 0.4 mg/dL (ref 0.0–1.2)
CO2: 25 mmol/L (ref 20–29)
Calcium: 9.6 mg/dL (ref 8.7–10.2)
Chloride: 102 mmol/L (ref 96–106)
Creatinine, Ser: 0.75 mg/dL (ref 0.57–1.00)
Globulin, Total: 2.5 g/dL (ref 1.5–4.5)
Glucose: 148 mg/dL — ABNORMAL HIGH (ref 70–99)
Potassium: 3.7 mmol/L (ref 3.5–5.2)
Sodium: 141 mmol/L (ref 134–144)
Total Protein: 7 g/dL (ref 6.0–8.5)
eGFR: 96 mL/min/{1.73_m2} (ref >59)

## 2023-12-07 LAB — LIPID PANEL
Chol/HDL Ratio: 3.4 ratio (ref 0.0–4.4)
Cholesterol, Total: 229 mg/dL — ABNORMAL HIGH (ref 100–199)
HDL: 68 mg/dL (ref >39)
LDL Chol Calc (NIH): 142 mg/dL — ABNORMAL HIGH (ref 0–99)
Triglycerides: 110 mg/dL (ref 0–149)
VLDL Cholesterol Cal: 19 mg/dL (ref 5–40)

## 2023-12-07 LAB — HEMOGLOBIN A1C
Est. average glucose Bld gHb Est-mCnc: 137 mg/dL
Hgb A1c MFr Bld: 6.4 % — ABNORMAL HIGH (ref 4.8–5.6)

## 2023-12-10 ENCOUNTER — Telehealth: Payer: Self-pay

## 2023-12-10 NOTE — Telephone Encounter (Signed)
 Telephoned patient at mobile number using interpreter, Mechele Spiegel. Left a voice message we are mailing a mammogram scholarship application. [employer]

## 2023-12-23 ENCOUNTER — Telehealth: Payer: Self-pay

## 2023-12-23 NOTE — Telephone Encounter (Signed)
 Telephoned patient at mobile number using interpreter#[phone]. Left a voice message with [employer] (scholarship) contact information.

## 2024-01-31 ENCOUNTER — Other Ambulatory Visit: Payer: Self-pay

## 2024-04-27 ENCOUNTER — Other Ambulatory Visit: Payer: Self-pay

## 2024-05-04 ENCOUNTER — Other Ambulatory Visit: Payer: Self-pay

## 2024-05-06 ENCOUNTER — Other Ambulatory Visit: Payer: Self-pay

## 2024-06-01 ENCOUNTER — Other Ambulatory Visit: Payer: Self-pay | Admitting: Nurse Practitioner

## 2024-06-01 ENCOUNTER — Other Ambulatory Visit: Payer: Self-pay

## 2024-06-01 DIAGNOSIS — I10 Essential (primary) hypertension: Secondary | ICD-10-CM

## 2024-06-02 ENCOUNTER — Other Ambulatory Visit: Payer: Self-pay

## 2024-06-02 MED ORDER — HYDROCHLOROTHIAZIDE 25 MG PO TABS
25.0000 mg | ORAL_TABLET | Freq: Every day | ORAL | 0 refills | Status: DC
  Filled 2024-06-02: qty 90, 90d supply, fill #0

## 2024-06-02 NOTE — Telephone Encounter (Signed)
 Requested Prescriptions  Pending Prescriptions Disp Refills   hydrochlorothiazide  (HYDRODIURIL ) 25 MG tablet 90 tablet 0    Sig: Take 1 tablet (25 mg total) by mouth daily.     Cardiovascular: Diuretics - Thiazide Passed - 06/02/2024  1:58 PM      Passed - Cr in normal range and within 180 days    Creatinine, Ser  Date Value Ref Range Status  12/06/2023 0.75 0.57 - 1.00 mg/dL Final   Creatinine, Urine  Date Value Ref Range Status  12/29/2013 41.89 mg/dL Final         Passed - K in normal range and within 180 days    Potassium  Date Value Ref Range Status  12/06/2023 3.7 3.5 - 5.2 mmol/L Final         Passed - Na in normal range and within 180 days    Sodium  Date Value Ref Range Status  12/06/2023 141 134 - 144 mmol/L Final         Passed - Last BP in normal range    BP Readings from Last 1 Encounters:  12/06/23 134/82         Passed - Valid encounter within last 6 months    Recent Outpatient Visits           5 months ago Primary hypertension   [facility] [place]. [facility] Theotis Haze ORN, NP   9 months ago Primary hypertension   [facility] Of [place]. [facility] Theotis Haze ORN, NP   11 months ago Primary hypertension   [facility] - [facility] Dept Of [place]. [facility] Theotis Haze ORN, NP   1 year ago Abnormal uterine bleeding (AUB)   [facility] - [facility] Dept Of [place]. [facility] Theotis Haze ORN, NP   1 year ago Prediabetes   [facility] - [facility] Dept Of [place]. [facility] Theotis Haze ORN, [place]

## 2024-06-08 ENCOUNTER — Encounter: Payer: Self-pay | Admitting: Nurse Practitioner

## 2024-06-08 ENCOUNTER — Ambulatory Visit: Payer: Self-pay | Attending: Nurse Practitioner | Admitting: Nurse Practitioner

## 2024-06-08 VITALS — BP 137/77 | HR 74 | Resp 19 | Ht 61.0 in | Wt 168.0 lb

## 2024-06-08 DIAGNOSIS — Z23 Encounter for immunization: Secondary | ICD-10-CM

## 2024-06-08 DIAGNOSIS — I10 Essential (primary) hypertension: Secondary | ICD-10-CM

## 2024-06-08 DIAGNOSIS — E7800 Pure hypercholesterolemia, unspecified: Secondary | ICD-10-CM

## 2024-06-08 DIAGNOSIS — R7303 Prediabetes: Secondary | ICD-10-CM

## 2024-06-08 NOTE — Patient Instructions (Addendum)
 [facility] has been trying to call you to schedule a mammogram. You can call them at any of these numbers to schedule.  Please call [facility] at [phone] to schedule your appointment.  If you have already scheduled your appointment, please disregard this letter.

## 2024-06-08 NOTE — Progress Notes (Unsigned)
 Assessment & Plan:  Connie Newman was seen today for hypertension.  Diagnoses and all orders for this visit:  Primary hypertension -     CMP14+EGFR ContinueHCTZ as prescribed.  Reminded to bring in blood pressure log for follow  up appointment.  RECOMMENDATIONS: DASH/Mediterranean Diets are healthier choices for HTN.    Hypercholesterolemia -     Lipid panel INSTRUCTIONS: Work on a low fat, heart healthy diet and participate in regular aerobic exercise program by working out at least 150 minutes per week; 5 days a week-30 minutes per day. Avoid red meat/beef/steak,  fried foods. junk foods, sodas, sugary drinks, unhealthy snacking, alcohol and smoking.  Drink at least 80 oz of water per day and monitor your carbohydrate intake daily.    Prediabetes -     Hemoglobin A1c    Patient has been counseled on age-appropriate routine health concerns for screening and prevention. These are reviewed and up-to-date. Referrals have been placed accordingly. Immunizations are up-to-date or declined.    Subjective:   Chief Complaint  Patient presents with   Hypertension    Connie Newman 53 y.o. female presents to office today for follow up to HTN  VRI was used to communicate directly with patient for the entire encounter including providing detailed patient instructions.    Blood pressure is well controlled with hydrochlorothiazide  25 mg daily.  BP Readings from Last 3 Encounters:  06/08/24 137/77  12/06/23 134/82  09/03/23 127/82    Review of Systems  Constitutional:  Negative for fever, malaise/fatigue and weight loss.  HENT: Negative.  Negative for nosebleeds.   Eyes: Negative.  Negative for blurred vision, double vision and photophobia.  Respiratory: Negative.  Negative for cough and shortness of breath.   Cardiovascular: Negative.  Negative for chest pain, palpitations and leg swelling.  Gastrointestinal: Negative.  Negative for heartburn, nausea and vomiting.   Musculoskeletal: Negative.  Negative for myalgias.  Neurological: Negative.  Negative for dizziness, focal weakness, seizures and headaches.  Psychiatric/Behavioral: Negative.  Negative for suicidal ideas.     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    has sensation, but unable to move that side   Hypertension     Past Surgical History:  Procedure Laterality Date   CESAREAN SECTION N/A 12/30/2013   Procedure: CESAREAN SECTION;  Surgeon: Harland JAYSON Birkenhead, MD;  Location: [facility];  Service: Obstetrics;  Laterality: N/A;   CESAREAN SECTION N/A 12/05/2015   Procedure: CESAREAN SECTION;  Surgeon: Norleen Edsel GAILS, MD;  Location: [facility];  Service: Obstetrics;  Laterality: N/A;   NO PAST SURGERIES      Family History  Problem Relation Age of Onset   Hypertension Mother    Diabetes Father    Diabetes Paternal Grandmother    Diabetes Paternal Grandfather    Breast cancer Neg Hx     Social History Reviewed with no changes to be made today.   Outpatient Medications Prior to Visit  Medication Sig Dispense Refill   hydrochlorothiazide  (HYDRODIURIL ) 25 MG tablet Take 1 tablet (25 mg total) by mouth daily. 90 tablet 0   No facility-administered medications prior to visit.    No Known Allergies     Objective:    BP 137/77 (BP Location: Left Arm, Patient Position: Sitting, Cuff Size: Normal)   Pulse 74   Resp 19   Ht 5' 1 (1.549 m)   Wt  168 lb (76.2 kg)   SpO2 99%   BMI 31.74 kg/m  Wt Readings from Last 3 Encounters:  06/08/24 168 lb (76.2 kg)  12/06/23 166 lb (75.3 kg)  09/03/23 167 lb 9.6 oz (76 kg)    Physical Exam Vitals and nursing note reviewed.  Constitutional:      Appearance: She is well-developed.  HENT:     Head: Normocephalic and atraumatic.  Cardiovascular:     Rate and Rhythm: Normal rate and regular rhythm.     Heart sounds: Normal heart sounds. No murmur heard.    No  friction rub. No gallop.  Pulmonary:     Effort: Pulmonary effort is normal. No tachypnea or respiratory distress.     Breath sounds: Normal breath sounds. No decreased breath sounds, wheezing, rhonchi or rales.  Chest:     Chest wall: No tenderness.  Musculoskeletal:        General: Normal range of motion.     Cervical back: Normal range of motion.  Skin:    General: Skin is warm and dry.  Neurological:     Mental Status: She is alert and oriented to person, place, and time.     Coordination: Coordination normal.  Psychiatric:        Behavior: Behavior normal. Behavior is cooperative.        Thought Content: Thought content normal.        Judgment: Judgment normal.          Patient has been counseled extensively about nutrition and exercise as well as the importance of adherence with medications and regular follow-up. The patient was given clear instructions to go to ER or return to medical center if symptoms don't improve, worsen or new problems develop. The patient verbalized understanding.   Follow-up: Return in about 6 months (around 12/06/2024).   Haze LELON Servant, FNP-BC [facility] and [facility] [place], [place] [phone]   06/09/2024, 10:13 PM

## 2024-06-09 ENCOUNTER — Encounter: Payer: Self-pay | Admitting: Nurse Practitioner

## 2024-06-09 ENCOUNTER — Ambulatory Visit: Payer: Self-pay | Admitting: Nurse Practitioner

## 2024-06-09 LAB — CMP14+EGFR
ALT: 25 IU/L (ref 0–32)
AST: 17 IU/L (ref 0–40)
Albumin: 4.7 g/dL (ref 3.8–4.9)
Alkaline Phosphatase: 91 IU/L (ref 49–135)
BUN/Creatinine Ratio: 30 — ABNORMAL HIGH (ref 9–23)
BUN: 19 mg/dL (ref 6–24)
Bilirubin Total: 0.4 mg/dL (ref 0.0–1.2)
CO2: 23 mmol/L (ref 20–29)
Calcium: 9.4 mg/dL (ref 8.7–10.2)
Chloride: 102 mmol/L (ref 96–106)
Creatinine, Ser: 0.64 mg/dL (ref 0.57–1.00)
Globulin, Total: 2.9 g/dL (ref 1.5–4.5)
Glucose: 106 mg/dL — ABNORMAL HIGH (ref 70–99)
Potassium: 3.7 mmol/L (ref 3.5–5.2)
Sodium: 142 mmol/L (ref 134–144)
Total Protein: 7.6 g/dL (ref 6.0–8.5)
eGFR: 107 mL/min/1.73 (ref >59)

## 2024-06-09 LAB — LIPID PANEL
Chol/HDL Ratio: 3.8 ratio (ref 0.0–4.4)
Cholesterol, Total: 275 mg/dL — ABNORMAL HIGH (ref 100–199)
HDL: 72 mg/dL (ref >39)
LDL Chol Calc (NIH): 180 mg/dL — ABNORMAL HIGH (ref 0–99)
Triglycerides: 131 mg/dL (ref 0–149)
VLDL Cholesterol Cal: 23 mg/dL (ref 5–40)

## 2024-06-09 LAB — HEMOGLOBIN A1C
Est. average glucose Bld gHb Est-mCnc: 137 mg/dL
Hgb A1c MFr Bld: 6.4 % — ABNORMAL HIGH (ref 4.8–5.6)

## 2024-06-11 NOTE — Telephone Encounter (Signed)
 Copied from CRM [id number]. Topic: Clinical - Lab/Test Results >> Jun 11, 2024  9:36 AM Tobias CROME wrote:  Reason for CRM: Relayed results to patient. No further questions.

## 2024-06-11 NOTE — Telephone Encounter (Signed)
 Noted

## 2024-08-25 ENCOUNTER — Other Ambulatory Visit: Payer: Self-pay

## 2024-08-25 ENCOUNTER — Other Ambulatory Visit: Payer: Self-pay | Admitting: Nurse Practitioner

## 2024-08-25 DIAGNOSIS — I10 Essential (primary) hypertension: Secondary | ICD-10-CM

## 2024-08-25 MED ORDER — HYDROCHLOROTHIAZIDE 25 MG PO TABS
25.0000 mg | ORAL_TABLET | Freq: Every day | ORAL | 0 refills | Status: AC
  Filled 2024-08-25: qty 90, 90d supply, fill #0

## 2024-08-27 ENCOUNTER — Other Ambulatory Visit: Payer: Self-pay
